# Patient Record
Sex: Male | Born: 1995
Health system: Southern US, Community
[De-identification: ages and names within clinical notes are randomized; demographics above are authoritative.]

## PROBLEM LIST (undated history)

## (undated) DIAGNOSIS — J45909 Unspecified asthma, uncomplicated: Secondary | ICD-10-CM

## (undated) HISTORY — PX: WISDOM TOOTH EXTRACTION: SHX21

---
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
## 2016-01-12 DIAGNOSIS — J4541 Moderate persistent asthma with (acute) exacerbation: Secondary | ICD-10-CM | POA: Diagnosis not present

## 2016-01-12 MED FILL — predniSONE 20 MG TABS: 20 | 5 days supply | Qty: 15 | Fill #0

## 2016-02-05 MED FILL — ADVAIR 100/50 DISKUS: 100-50 | 30 days supply | Qty: 60 | Fill #1

## 2016-04-23 DIAGNOSIS — J329 Chronic sinusitis, unspecified: Secondary | ICD-10-CM | POA: Diagnosis not present

## 2016-04-23 DIAGNOSIS — B9689 Other specified bacterial agents as the cause of diseases classified elsewhere: Secondary | ICD-10-CM | POA: Diagnosis not present

## 2016-04-23 MED FILL — CEFDINIR 300 MG CAPSULE: 300 | 10 days supply | Qty: 20 | Fill #0

## 2016-07-09 MED FILL — ADVAIR 100/50 DISKUS: 100-50 | 30 days supply | Qty: 60 | Fill #0

## 2016-10-10 DIAGNOSIS — J019 Acute sinusitis, unspecified: Secondary | ICD-10-CM | POA: Diagnosis not present

## 2016-10-10 DIAGNOSIS — B9689 Other specified bacterial agents as the cause of diseases classified elsewhere: Secondary | ICD-10-CM | POA: Diagnosis not present

## 2016-10-10 DIAGNOSIS — J40 Bronchitis, not specified as acute or chronic: Secondary | ICD-10-CM | POA: Diagnosis not present

## 2016-10-10 DIAGNOSIS — J9801 Acute bronchospasm: Secondary | ICD-10-CM | POA: Diagnosis not present

## 2016-10-10 DIAGNOSIS — J309 Allergic rhinitis, unspecified: Secondary | ICD-10-CM | POA: Diagnosis not present

## 2016-10-10 MED FILL — AZITHROMYCIN 500 MG TABLET: 500 | 3 days supply | Qty: 3 | Fill #0

## 2016-10-10 MED FILL — predniSONE 50 MG TABS: 50 | 5 days supply | Qty: 5 | Fill #0

## 2016-11-18 DIAGNOSIS — Z23 Encounter for immunization: Secondary | ICD-10-CM | POA: Diagnosis not present

## 2016-11-29 MED FILL — MONTELUKAST SOD 10 MG TAB: 10 | 30 days supply | Qty: 30 | Fill #0

## 2016-11-29 MED FILL — ADVAIR 100/50 DISKUS: 100-50 | 30 days supply | Qty: 60 | Fill #0

## 2016-12-09 MED FILL — CONCERTA 36 MG TABLET ER: 36 | 90 days supply | Qty: 90 | Fill #0

## 2017-04-23 DIAGNOSIS — Z0100 Encounter for examination of eyes and vision without abnormal findings: Secondary | ICD-10-CM | POA: Diagnosis not present

## 2017-08-07 DIAGNOSIS — F4310 Post-traumatic stress disorder, unspecified: Secondary | ICD-10-CM | POA: Diagnosis not present

## 2017-08-13 DIAGNOSIS — F4310 Post-traumatic stress disorder, unspecified: Secondary | ICD-10-CM | POA: Diagnosis not present

## 2017-08-21 DIAGNOSIS — F4310 Post-traumatic stress disorder, unspecified: Secondary | ICD-10-CM | POA: Diagnosis not present

## 2017-09-10 DIAGNOSIS — J4540 Moderate persistent asthma, uncomplicated: Secondary | ICD-10-CM | POA: Diagnosis not present

## 2017-09-10 DIAGNOSIS — J019 Acute sinusitis, unspecified: Secondary | ICD-10-CM | POA: Diagnosis not present

## 2017-09-10 DIAGNOSIS — B9689 Other specified bacterial agents as the cause of diseases classified elsewhere: Secondary | ICD-10-CM | POA: Diagnosis not present

## 2017-09-10 MED FILL — MONTELUKAST SOD 10 MG TAB: 10 | 30 days supply | Qty: 30 | Fill #0

## 2017-09-10 MED FILL — ADVAIR 100/50 DISKUS: 100-50 | 30 days supply | Qty: 60 | Fill #0

## 2017-09-10 MED FILL — CEFDINIR 300 MG CAPSULE: 300 | 10 days supply | Qty: 20 | Fill #0

## 2017-09-10 MED FILL — VENTOLIN HFA 90 MCG INHALER: 108 (90 BAS | 17 days supply | Qty: 18 | Fill #0

## 2017-10-20 DIAGNOSIS — F4310 Post-traumatic stress disorder, unspecified: Secondary | ICD-10-CM | POA: Diagnosis not present

## 2017-10-29 DIAGNOSIS — F418 Other specified anxiety disorders: Secondary | ICD-10-CM | POA: Diagnosis not present

## 2017-10-29 DIAGNOSIS — F4310 Post-traumatic stress disorder, unspecified: Secondary | ICD-10-CM | POA: Diagnosis not present

## 2017-10-29 DIAGNOSIS — F909 Attention-deficit hyperactivity disorder, unspecified type: Secondary | ICD-10-CM | POA: Diagnosis not present

## 2017-10-29 DIAGNOSIS — J4530 Mild persistent asthma, uncomplicated: Secondary | ICD-10-CM | POA: Diagnosis not present

## 2017-10-29 DIAGNOSIS — Z23 Encounter for immunization: Secondary | ICD-10-CM | POA: Diagnosis not present

## 2017-10-29 MED FILL — ADVAIR 100/50 DISKUS: 100-50 | 30 days supply | Qty: 60 | Fill #0

## 2017-10-29 MED FILL — MONTELUKAST SOD 10 MG TAB: 10 | 30 days supply | Qty: 30 | Fill #0

## 2017-10-29 MED FILL — SERTRALINE HCL 50 MG TABLET: 50 | 30 days supply | Qty: 30 | Fill #0

## 2017-11-12 DIAGNOSIS — F411 Generalized anxiety disorder: Secondary | ICD-10-CM | POA: Diagnosis not present

## 2017-11-19 DIAGNOSIS — F4310 Post-traumatic stress disorder, unspecified: Secondary | ICD-10-CM | POA: Diagnosis not present

## 2017-12-01 MED FILL — OSELTAMIVIR PHOSPHATE 75 MG: 75 | 10 days supply | Qty: 10 | Fill #0

## 2017-12-02 DIAGNOSIS — F411 Generalized anxiety disorder: Secondary | ICD-10-CM | POA: Diagnosis not present

## 2017-12-03 MED FILL — SERTRALINE HCL 50 MG TABLET: 50 | 30 days supply | Qty: 30 | Fill #1

## 2017-12-10 DIAGNOSIS — F418 Other specified anxiety disorders: Secondary | ICD-10-CM | POA: Diagnosis not present

## 2017-12-16 DIAGNOSIS — F411 Generalized anxiety disorder: Secondary | ICD-10-CM | POA: Diagnosis not present

## 2017-12-29 MED FILL — SERTRALINE HCL 50 MG TABLET: 50 | 90 days supply | Qty: 90 | Fill #0

## 2018-01-14 DIAGNOSIS — F902 Attention-deficit hyperactivity disorder, combined type: Secondary | ICD-10-CM | POA: Diagnosis not present

## 2018-01-22 DIAGNOSIS — J4541 Moderate persistent asthma with (acute) exacerbation: Secondary | ICD-10-CM | POA: Diagnosis not present

## 2018-01-22 DIAGNOSIS — J0110 Acute frontal sinusitis, unspecified: Secondary | ICD-10-CM | POA: Diagnosis not present

## 2018-01-22 MED FILL — AMOXICILLIN 500 MG CAPSULE: 500 | 10 days supply | Qty: 40 | Fill #0

## 2018-02-18 DIAGNOSIS — F4310 Post-traumatic stress disorder, unspecified: Secondary | ICD-10-CM | POA: Diagnosis not present

## 2018-04-03 MED FILL — SERTRALINE HCL 50 MG TABLET: 50 | 90 days supply | Qty: 90 | Fill #1

## 2018-05-07 DIAGNOSIS — Z0100 Encounter for examination of eyes and vision without abnormal findings: Secondary | ICD-10-CM | POA: Diagnosis not present

## 2018-06-09 DIAGNOSIS — Z23 Encounter for immunization: Secondary | ICD-10-CM | POA: Diagnosis not present

## 2018-06-09 DIAGNOSIS — J4530 Mild persistent asthma, uncomplicated: Secondary | ICD-10-CM | POA: Diagnosis not present

## 2018-06-09 DIAGNOSIS — F418 Other specified anxiety disorders: Secondary | ICD-10-CM | POA: Diagnosis not present

## 2018-07-10 MED FILL — SERTRALINE HCL 50 MG TABLET: 50 | 90 days supply | Qty: 90 | Fill #0

## 2018-08-20 ENCOUNTER — Ambulatory Visit (INDEPENDENT_AMBULATORY_CARE_PROVIDER_SITE_OTHER): Payer: Self-pay | Admitting: Nurse Practitioner

## 2018-08-20 VITALS — BP 100/80 | HR 144 | Temp 101.9°F | Wt 201.8 lb

## 2018-08-20 DIAGNOSIS — J101 Influenza due to other identified influenza virus with other respiratory manifestations: Secondary | ICD-10-CM

## 2018-08-20 MED ORDER — ONDANSETRON 4 MG PO TBDP: 4.0000 mg | ORAL_TABLET | Freq: Three times a day (TID) | ORAL | 0 refills | Status: AC | PRN

## 2018-08-20 MED ORDER — OSELTAMIVIR PHOSPHATE 75 MG PO CAPS: 75.0000 mg | ORAL_CAPSULE | Freq: Two times a day (BID) | ORAL | 0 refills | Status: AC

## 2018-08-20 NOTE — Progress Notes (Signed)
Subjective:     Larry Espinoza is a 22 y.o. male who presents for evaluation of influenza like symptoms. Symptoms include fevers up to 102.4 degrees, chills, headache, myalgias, productive cough and sore throat and have been present for today  He has tried to alleviate the symptoms with rest with no relief. High risk factors for influenza complications: co-morbid illness.  The patient does have an underlying history of asthma.  Patient states he did have to use his inhaler today for wheezing.  Patient states his asthma is otherwise well controlled.  No recent asthma exacerbations.  Patient also states he uses his inhaler very seldomly.  Patient has had an influenza vaccine this season.  The following portions of the patient's history were reviewed and updated as appropriate: allergies, current medications and past medical history.  Review of Systems Constitutional: positive for anorexia, chills, fatigue, fevers, malaise and sweats, negative for night sweats and weight loss Eyes: negative Ears, nose, mouth, throat, and face: positive for nasal congestion and sore throat, negative for ear drainage, earaches and hoarseness Respiratory: positive for asthma, cough, sputum and wheezing, negative for dyspnea on exertion, pneumonia and stridor Cardiovascular: negative Gastrointestinal: positive for nausea and decreased appetite, negative for diarrhea, nausea and vomiting Neurological: positive for headaches, negative for coordination problems, dizziness, paresthesia, tremors, vertigo and weakness     Objective:    BP 100/80 (BP Location: Right Arm, Patient Position: Sitting)   Pulse (!) 144   Temp (!) 101.9 F (38.8 C) (Oral)   Wt 201 lb 12.8 oz (91.5 kg)   SpO2 98%   BMI 28.15 kg/m  General appearance: alert, cooperative, fatigued and ill-appearing Head: Normocephalic, without obvious abnormality, atraumatic Eyes: conjunctivae/corneas clear. PERRL, EOM's intact. Fundi benign. Ears: normal TM's  and external ear canals both ears Nose: no discharge, moderate congestion, sinus tenderness bilateral Throat: abnormal findings: moderate oropharyngeal erythema and no edema, tonsils 0 bilaterally Lungs: clear to auscultation bilaterally Heart: regular rate and rhythm, S1, S2 normal, no murmur, click, rub or gallop Abdomen: soft, non-tender; bowel sounds normal; no masses,  no organomegaly Pulses: 2+ and symmetric Skin: Skin color, texture, turgor normal. No rashes or lesions Lymph nodes: mild bilateral cervical adenopathy Neurologic: Grossly normal    Patient vomited a large amount of emesis during this visit.  Contained normal food content.  Assessment:   Influenza A   Plan:   Exam findings, diagnosis etiology and medication use and indications reviewed with patient. Follow- Up and discharge instructions provided. No emergent/urgent issues found on exam. Patient education was provided. Patient verbalized understanding of information provided and agrees with plan of care (POC), all questions answered. The patient is advised to call or return to clinic if condition does not see an improvement in symptoms, or to seek the care of the closest emergency department if condition worsens with the above plan.   1. Influenza A  - oseltamivir (TAMIFLU) 75 MG capsule; Take 1 capsule (75 mg total) by mouth 2 (two) times daily for 5 days.  Dispense: 10 capsule; Refill: 0 -Take medication as prescribed. -Ibuprofen or Tylenol for pain, fever, or general discomfort. -Increase fluids to prevent dehydration. -Use a humidifier or vaporizer as needed. -Warm salt water gargles for throat pain or discomfort. -Good hand washing while symptoms persist. -Make sure to cover your mouth with coughing or sneezing. -Follow-up in the emergency department if you have worsening fever, abdominal pains, difficulty breathing, or other concerns. -Note was provided to return to work and school  on Monday, August 24, 2018.

## 2018-08-20 NOTE — Patient Instructions (Addendum)
Influenza, Adult  -Take medication as prescribed. -Ibuprofen or Tylenol for pain, fever, or general discomfort. -Increase fluids to prevent dehydration. -Use a humidifier or vaporizer as needed. -Warm salt water gargles for throat pain or discomfort. -Good hand washing while symptoms persist. -Make sure to cover your mouth with coughing or sneezing. -Follow-up in the emergency department if you have worsening fever, abdominal pains, difficulty breathing, or other concerns. -Note was provided to return to work and school on Monday, August 24, 2018.   Influenza, more commonly known as "the flu," is a viral infection that primarily affects the respiratory tract. The respiratory tract includes organs that help you breathe, such as the lungs, nose, and throat. The flu causes many common cold symptoms, as well as a high fever and body aches. The flu spreads easily from person to person (is contagious). Getting a flu shot (influenza vaccination) every year is the best way to prevent influenza. What are the causes? Influenza is caused by a virus. You can catch the virus by:  Breathing in droplets from an infected person's cough or sneeze.  Touching something that was recently contaminated with the virus and then touching your mouth, nose, or eyes.  What increases the risk? The following factors may make you more likely to get the flu:  Not cleaning your hands frequently with soap and water or alcohol-based hand sanitizer.  Having close contact with many people during cold and flu season.  Touching your mouth, eyes, or nose without washing or sanitizing your hands first.  Not drinking enough fluids or not eating a healthy diet.  Not getting enough sleep or exercise.  Being under a high amount of stress.  Not getting a yearly (annual) flu shot.  You may be at a higher risk of complications from the flu, such as a severe lung infection (pneumonia), if you:  Are over the age of  51.  Are pregnant.  Have a weakened disease-fighting system (immune system). You may have a weakened immune system if you: ? Have HIV or AIDS. ? Are undergoing chemotherapy. ? Aretaking medicines that reduce the activity of (suppress) the immune system.  Have a long-term (chronic) illness, such as heart disease, kidney disease, diabetes, or lung disease.  Have a liver disorder.  Are obese.  Have anemia.  What are the signs or symptoms? Symptoms of this condition typically last 4-10 days and may include:  Fever.  Chills.  Headache, body aches, or muscle aches.  Sore throat.  Cough.  Runny or congested nose.  Chest discomfort and cough.  Poor appetite.  Weakness or tiredness (fatigue).  Dizziness.  Nausea or vomiting.  How is this diagnosed? This condition may be diagnosed based on your medical history and a physical exam. Your health care provider may do a nose or throat swab test to confirm the diagnosis. How is this treated? If influenza is detected early, you can be treated with antiviral medicine that can reduce the length of your illness and the severity of your symptoms. This medicine may be given by mouth (orally) or through an IV tube that is inserted in one of your veins. The goal of treatment is to relieve symptoms by taking care of yourself at home. This may include taking over-the-counter medicines, drinking plenty of fluids, and adding humidity to the air in your home. In some cases, influenza goes away on its own. Severe influenza or complications from influenza may be treated in a hospital. Follow these instructions at home:  Take  over-the-counter and prescription medicines only as told by your health care provider.  Use a cool mist humidifier to add humidity to the air in your home. This can make breathing easier.  Rest as needed.  Drink enough fluid to keep your urine clear or pale yellow.  Cover your mouth and nose when you cough or  sneeze.  Wash your hands with soap and water often, especially after you cough or sneeze. If soap and water are not available, use hand sanitizer.  Stay home from work or school as told by your health care provider. Unless you are visiting your health care provider, try to avoid leaving home until your fever has been gone for 24 hours without the use of medicine.  Keep all follow-up visits as told by your health care provider. This is important. How is this prevented?  Getting an annual flu shot is the best way to avoid getting the flu. You may get the flu shot in late summer, fall, or winter. Ask your health care provider when you should get your flu shot.  Wash your hands often or use hand sanitizer often.  Avoid contact with people who are sick during cold and flu season.  Eat a healthy diet, drink plenty of fluids, get enough sleep, and exercise regularly. Contact a health care provider if:  You develop new symptoms.  You have: ? Chest pain. ? Diarrhea. ? A fever.  Your cough gets worse.  You produce more mucus.  You feel nauseous or you vomit. Get help right away if:  You develop shortness of breath or difficulty breathing.  Your skin or nails turn a bluish color.  You have severe pain or stiffness in your neck.  You develop a sudden headache or sudden pain in your face or ear.  You cannot stop vomiting. This information is not intended to replace advice given to you by your health care provider. Make sure you discuss any questions you have with your health care provider. Document Released: 09/27/2000 Document Revised: 03/07/2016 Document Reviewed: 07/25/2015 Elsevier Interactive Patient Education  2017 [employer].

## 2018-08-24 DIAGNOSIS — J019 Acute sinusitis, unspecified: Secondary | ICD-10-CM | POA: Diagnosis not present

## 2018-08-24 LAB — POCT INFLUENZA A/B
Influenza A, POC: POSITIVE — AB
Influenza B, POC: NEGATIVE

## 2018-08-24 MED FILL — AMOX-CLAV 875-125 MG TABLET: 875-125 | 10 days supply | Qty: 20 | Fill #0

## 2018-08-24 NOTE — Addendum Note (Signed)
Addended by: Arnette Felts on: 08/24/2018 08:12 AM   Modules accepted: Orders

## 2018-09-15 DIAGNOSIS — J4530 Mild persistent asthma, uncomplicated: Secondary | ICD-10-CM | POA: Diagnosis not present

## 2018-09-15 DIAGNOSIS — J0100 Acute maxillary sinusitis, unspecified: Secondary | ICD-10-CM | POA: Diagnosis not present

## 2018-09-15 MED FILL — AMOX-CLAV 875-125 MG TABLET: 875-125 | 10 days supply | Qty: 20 | Fill #0

## 2018-11-18 DIAGNOSIS — Z23 Encounter for immunization: Secondary | ICD-10-CM | POA: Diagnosis not present

## 2018-11-18 DIAGNOSIS — Z0000 Encounter for general adult medical examination without abnormal findings: Secondary | ICD-10-CM | POA: Diagnosis not present

## 2018-11-18 DIAGNOSIS — J4530 Mild persistent asthma, uncomplicated: Secondary | ICD-10-CM | POA: Diagnosis not present

## 2018-11-18 DIAGNOSIS — F418 Other specified anxiety disorders: Secondary | ICD-10-CM | POA: Diagnosis not present

## 2018-11-18 DIAGNOSIS — Z13220 Encounter for screening for lipoid disorders: Secondary | ICD-10-CM | POA: Diagnosis not present

## 2018-11-18 DIAGNOSIS — J309 Allergic rhinitis, unspecified: Secondary | ICD-10-CM | POA: Diagnosis not present

## 2019-05-24 DIAGNOSIS — M25561 Pain in right knee: Secondary | ICD-10-CM | POA: Diagnosis not present

## 2019-05-24 DIAGNOSIS — M25562 Pain in left knee: Secondary | ICD-10-CM | POA: Diagnosis not present

## 2019-06-19 ENCOUNTER — Telehealth: Payer: 59 | Admitting: Family

## 2019-06-19 DIAGNOSIS — Z20822 Contact with and (suspected) exposure to covid-19: Secondary | ICD-10-CM

## 2019-06-19 NOTE — Progress Notes (Signed)
E-Visit for Corona Virus Screening   Your current symptoms could be consistent with the coronavirus.  Many health care providers can now test patients at their office but not all are.  [facility] has multiple testing sites. For information on our COVID testing locations and hours go to [email or web address]     Testing is available Monday through Friday 8am-330pm at [address] You do not require emergency testing. Please self-quarantine for now and report for testing Monday.   Please quarantine yourself while awaiting your test results.  We are enrolling you in our [facility] . Daily you will receive a questionnaire within the [facility] website. Our COVID 19 response team willl be monitoriing your responses daily.    COVID-19 is a respiratory illness with symptoms that are similar to the flu. Symptoms are typically mild to moderate, but there have been cases of severe illness and death due to the virus. The following symptoms may appear 2-14 days after exposure: . Fever . Cough . Shortness of breath or difficulty breathing . Chills . Repeated shaking with chills . Muscle pain . Headache . Sore throat . New loss of taste or smell . Fatigue . Congestion or runny nose . Nausea or vomiting . Diarrhea  It is vitally important that if you feel that you have an infection such as this virus or any other virus that you stay home and away from places where you may spread it to others.  You should self-quarantine for 14 days if you have symptoms that could potentially be coronavirus or have been in close contact a with a person diagnosed with COVID-19 within the last 2 weeks. You should avoid contact with people age 3 and older.   You should wear a mask or cloth face covering over your nose and mouth if you must be around other people or animals, including pets (even at home). Try to stay at least 6 feet away from other people. This  will protect the people around you.   You may also take acetaminophen (Tylenol) as needed for fever.   Reduce your risk of any infection by using the same precautions used for avoiding the common cold or flu:  Marland Kitchen Wash your hands often with soap and warm water for at least 20 seconds.  If soap and water are not readily available, use an alcohol-based hand sanitizer with at least 60% alcohol.  . If coughing or sneezing, cover your mouth and nose by coughing or sneezing into the elbow areas of your shirt or coat, into a tissue or into your sleeve (not your hands). . Avoid shaking hands with others and consider head nods or verbal greetings only. . Avoid touching your eyes, nose, or mouth with unwashed hands.  . Avoid close contact with people who are sick. . Avoid places or events with large numbers of people in one location, like concerts or sporting events. . Carefully consider travel plans you have or are making. . If you are planning any travel outside or inside the [place], visit the CDC's Travelers' Health webpage for the latest health notices. . If you have some symptoms but not all symptoms, continue to monitor at home and seek medical attention if your symptoms worsen. . If you are having a medical emergency, call 911.  HOME CARE . Only take medications as instructed by your medical team. . Drink plenty of fluids and get plenty of rest. . A steam or ultrasonic humidifier can help  if you have congestion.   GET HELP RIGHT AWAY IF YOU HAVE EMERGENCY WARNING SIGNS** FOR COVID-19. If you or someone is showing any of these signs seek emergency medical care immediately. Call 911 or proceed to your closest emergency facility if: . You develop worsening high fever. . Trouble breathing . Bluish lips or face . Persistent pain or pressure in the chest . New confusion . Inability to wake or stay awake . You cough up blood. . Your symptoms become more severe  **This list is not all possible  symptoms. Contact your medical provider for any symptoms that are sever or concerning to you.   MAKE SURE YOU   Understand these instructions.  Will watch your condition.  Will get help right away if you are not doing well or get worse.  Your e-visit answers were reviewed by a board certified advanced clinical practitioner to complete your personal care plan.  Depending on the condition, your plan could have included both over the counter or prescription medications.  If there is a problem please reply once you have received a response from your provider.  Your safety is important to us.  If you have drug allergies check your prescription carefully.    You can use MyChart to ask questions about today's visit, request a non-urgent call back, or ask for a work or school excuse for 24 hours related to this e-Visit. If it has been greater than 24 hours you will need to follow up with your provider, or enter a new e-Visit to address those concerns. You will get an e-mail in the next two days asking about your experience.  I hope that your e-visit has been valuable and will speed your recovery. Thank you for using e-visits.   Greater than 5 minutes, yet less than 10 minutes of time have been spent researching, coordinating, and implementing care for this patient today.  Thank you for the details you included in the comment boxes. Those details are very helpful in determining the best course of treatment for you and help us to provide the best care.

## 2019-06-22 ENCOUNTER — Other Ambulatory Visit: Payer: Self-pay

## 2019-06-22 DIAGNOSIS — Z20822 Contact with and (suspected) exposure to covid-19: Secondary | ICD-10-CM

## 2019-06-24 LAB — NOVEL CORONAVIRUS, NAA: SARS-CoV-2, NAA: NOT DETECTED

## 2019-11-23 DIAGNOSIS — Z7689 Persons encountering health services in other specified circumstances: Secondary | ICD-10-CM | POA: Diagnosis not present

## 2019-11-23 DIAGNOSIS — Z0000 Encounter for general adult medical examination without abnormal findings: Secondary | ICD-10-CM | POA: Diagnosis not present

## 2019-11-23 DIAGNOSIS — Z13220 Encounter for screening for lipoid disorders: Secondary | ICD-10-CM | POA: Diagnosis not present

## 2019-11-25 DIAGNOSIS — Z0000 Encounter for general adult medical examination without abnormal findings: Secondary | ICD-10-CM | POA: Diagnosis not present

## 2019-11-25 DIAGNOSIS — Z13220 Encounter for screening for lipoid disorders: Secondary | ICD-10-CM | POA: Diagnosis not present

## 2019-11-25 DIAGNOSIS — J4530 Mild persistent asthma, uncomplicated: Secondary | ICD-10-CM | POA: Diagnosis not present

## 2019-11-25 DIAGNOSIS — J309 Allergic rhinitis, unspecified: Secondary | ICD-10-CM | POA: Diagnosis not present

## 2019-11-25 DIAGNOSIS — Z23 Encounter for immunization: Secondary | ICD-10-CM | POA: Diagnosis not present

## 2019-11-25 MED FILL — MONTELUKAST SOD 10 MG TAB: 10 | 90 days supply | Qty: 90 | Fill #0

## 2019-11-25 MED FILL — ADVAIR 100/50 DISKUS: 100-50 | 30 days supply | Qty: 60 | Fill #0

## 2019-11-25 MED FILL — ALBUTEROL SULFATE HFA 108 (: 108 (90 BAS | 25 days supply | Qty: 18 | Fill #0

## 2019-12-24 DIAGNOSIS — J0110 Acute frontal sinusitis, unspecified: Secondary | ICD-10-CM | POA: Diagnosis not present

## 2019-12-24 MED FILL — AMOX-CLAV 875-125 MG TABLET: 875-125 | 5 days supply | Qty: 10 | Fill #0

## 2020-04-25 MED FILL — ALBUTEROL SULFATE HFA 108 (: 108 (90 BAS | 25 days supply | Qty: 18 | Fill #1

## 2020-06-13 DIAGNOSIS — Z0100 Encounter for examination of eyes and vision without abnormal findings: Secondary | ICD-10-CM | POA: Diagnosis not present

## 2020-06-14 DIAGNOSIS — U071 COVID-19: Secondary | ICD-10-CM

## 2020-06-14 HISTORY — DX: COVID-19: U07.1

## 2020-07-03 DIAGNOSIS — J069 Acute upper respiratory infection, unspecified: Secondary | ICD-10-CM | POA: Diagnosis not present

## 2020-07-03 DIAGNOSIS — Z20822 Contact with and (suspected) exposure to covid-19: Secondary | ICD-10-CM | POA: Diagnosis not present

## 2020-07-03 MED FILL — BENZONATATE 100 MG CAPS: 100 | 10 days supply | Qty: 30 | Fill #0

## 2020-07-05 ENCOUNTER — Other Ambulatory Visit: Payer: Self-pay | Admitting: Internal Medicine

## 2020-07-05 DIAGNOSIS — J4521 Mild intermittent asthma with (acute) exacerbation: Secondary | ICD-10-CM | POA: Diagnosis not present

## 2020-07-05 DIAGNOSIS — J45909 Unspecified asthma, uncomplicated: Secondary | ICD-10-CM

## 2020-07-05 DIAGNOSIS — U071 COVID-19: Secondary | ICD-10-CM | POA: Diagnosis not present

## 2020-07-05 MED FILL — predniSONE 10 MG TABS: 10 | 12 days supply | Qty: 30 | Fill #0

## 2020-07-05 MED FILL — AZITHROMYCIN 250 MG TABS: 250 | 5 days supply | Qty: 6 | Fill #0

## 2020-07-05 NOTE — Progress Notes (Signed)
I connected by phone with Larry Espinoza on 07/05/2020 at 7:21 PM to discuss the potential use of a new treatment for mild to moderate COVID-19 viral infection in non-hospitalized patients.  This patient is a 24 y.o. male that meets the FDA criteria for Emergency Use Authorization of COVID monoclonal antibody casirivimab/imdevimab.  Has a (+) direct SARS-CoV-2 viral test result  Has mild or moderate COVID-19   Is NOT hospitalized due to COVID-19  Is within 10 days of symptom onset  Has at least one of the high risk factor(s) for progression to severe COVID-19 and/or hospitalization as defined in EUA.  Specific high risk criteria : Chronic Lung Disease   I have spoken and communicated the following to the patient or parent/caregiver regarding COVID monoclonal antibody treatment:  1. FDA has authorized the emergency use for the treatment of mild to moderate COVID-19 in adults and pediatric patients with positive results of direct SARS-CoV-2 viral testing who are 28 years of age and older weighing at least 40 kg, and who are at high risk for progressing to severe COVID-19 and/or hospitalization.  2. The significant known and potential risks and benefits of COVID monoclonal antibody, and the extent to which such potential risks and benefits are unknown.  3. Information on available alternative treatments and the risks and benefits of those alternatives, including clinical trials.  4. Patients treated with COVID monoclonal antibody should continue to self-isolate and use infection control measures (e.g., wear mask, isolate, social distance, avoid sharing personal items, clean and disinfect "high touch" surfaces, and frequent handwashing) according to CDC guidelines.   5. The patient or parent/caregiver has the option to accept or refuse COVID monoclonal antibody treatment.  After reviewing this information with the patient, The patient agreed to proceed with receiving casirivimab\imdevimab  infusion and will be provided a copy of the Fact sheet prior to receiving the infusion.  Cyndee Brightly, NP [facility]

## 2020-07-06 ENCOUNTER — Ambulatory Visit (HOSPITAL_COMMUNITY)
Admission: RE | Admit: 2020-07-06 | Discharge: 2020-07-06 | Disposition: A | Discharge status: Home / Self Care | Payer: 59 | Source: Ambulatory Visit | Attending: Pulmonary Disease | Admitting: Pulmonary Disease

## 2020-07-06 DIAGNOSIS — J45909 Unspecified asthma, uncomplicated: Secondary | ICD-10-CM | POA: Diagnosis not present

## 2020-07-06 DIAGNOSIS — U071 COVID-19: Secondary | ICD-10-CM | POA: Insufficient documentation

## 2020-07-06 MED ORDER — ONDANSETRON HCL 4 MG/2ML IJ SOLN
INTRAMUSCULAR | Status: AC
  Administered 2020-07-06: 4 mg
  Filled 2020-07-06: qty 2

## 2020-07-06 MED ORDER — SODIUM CHLORIDE 0.9 % IV SOLN: Freq: Once | INTRAVENOUS | Status: AC

## 2020-07-06 MED ORDER — ONDANSETRON HCL 4 MG/2ML IJ SOLN: 4.0000 mg | Freq: Once | INTRAMUSCULAR | Status: DC

## 2020-07-06 MED ORDER — SODIUM CHLORIDE 0.9 % IV SOLN: INTRAVENOUS | Status: DC | PRN

## 2020-07-06 MED ORDER — DIPHENHYDRAMINE HCL 50 MG/ML IJ SOLN: 50.0000 mg | Freq: Once | INTRAMUSCULAR | Status: DC | PRN

## 2020-07-06 MED ORDER — EPINEPHRINE 0.3 MG/0.3ML IJ SOAJ: 0.3000 mg | Freq: Once | INTRAMUSCULAR | Status: DC | PRN

## 2020-07-06 MED ORDER — SODIUM CHLORIDE 0.9 % IV SOLN
1200.0000 mg | Freq: Once | INTRAVENOUS | Status: AC
  Administered 2020-07-06: 1200 mg via INTRAVENOUS

## 2020-07-06 MED ORDER — METHYLPREDNISOLONE SODIUM SUCC 125 MG IJ SOLR: 125.0000 mg | Freq: Once | INTRAMUSCULAR | Status: DC | PRN

## 2020-07-06 MED ORDER — FAMOTIDINE IN NACL 20-0.9 MG/50ML-% IV SOLN: 20.0000 mg | Freq: Once | INTRAVENOUS | Status: DC | PRN

## 2020-07-06 MED ORDER — ALBUTEROL SULFATE HFA 108 (90 BASE) MCG/ACT IN AERS: 2.0000 | INHALATION_SPRAY | Freq: Once | RESPIRATORY_TRACT | Status: DC | PRN

## 2020-07-06 NOTE — Progress Notes (Signed)
  Diagnosis: COVID-19  Physician: Dr. Wright  Procedure: [facility] Med: casirivimab\imdevimab infusion - Provided patient with casirivimab\imdevimab fact sheet for patients, parents and caregivers prior to infusion.  Complications: No immediate complications noted.  Discharge: Discharged home   Larry Espinoza R Johndavid Geralds 07/06/2020   

## 2020-07-06 NOTE — Progress Notes (Signed)
Nausea relieved after zofran in given

## 2020-07-06 NOTE — Discharge Instructions (Signed)

## 2020-10-23 DIAGNOSIS — B079 Viral wart, unspecified: Secondary | ICD-10-CM | POA: Diagnosis not present

## 2020-10-23 DIAGNOSIS — B070 Plantar wart: Secondary | ICD-10-CM | POA: Diagnosis not present

## 2020-11-24 ENCOUNTER — Other Ambulatory Visit (HOSPITAL_COMMUNITY): Payer: Self-pay | Admitting: Physician Assistant

## 2020-11-24 ENCOUNTER — Ambulatory Visit
Admission: RE | Admit: 2020-11-24 | Discharge: 2020-11-24 | Disposition: A | Discharge status: Home / Self Care | Payer: 59 | Source: Ambulatory Visit | Attending: Physician Assistant | Admitting: Physician Assistant

## 2020-11-24 ENCOUNTER — Other Ambulatory Visit: Payer: Self-pay | Admitting: Physician Assistant

## 2020-11-24 DIAGNOSIS — R109 Unspecified abdominal pain: Secondary | ICD-10-CM

## 2020-11-24 DIAGNOSIS — R110 Nausea: Secondary | ICD-10-CM | POA: Diagnosis not present

## 2020-11-24 DIAGNOSIS — R3989 Other symptoms and signs involving the genitourinary system: Secondary | ICD-10-CM | POA: Diagnosis not present

## 2020-11-24 DIAGNOSIS — Z7689 Persons encountering health services in other specified circumstances: Secondary | ICD-10-CM | POA: Diagnosis not present

## 2020-11-24 MED FILL — traMADol HCL 50 MG TABS: 50 | 10 days supply | Qty: 10 | Fill #0

## 2020-11-24 MED FILL — ONDANSETRON ODT 4 MG TABLET: 4 | 10 days supply | Qty: 10 | Fill #0

## 2020-11-28 ENCOUNTER — Other Ambulatory Visit: Payer: Self-pay

## 2020-11-28 ENCOUNTER — Other Ambulatory Visit: Payer: Self-pay | Admitting: Physician Assistant

## 2020-11-28 ENCOUNTER — Ambulatory Visit
Admission: RE | Admit: 2020-11-28 | Discharge: 2020-11-28 | Disposition: A | Discharge status: Home / Self Care | Payer: 59 | Source: Ambulatory Visit | Attending: Physician Assistant | Admitting: Physician Assistant

## 2020-11-28 DIAGNOSIS — J4530 Mild persistent asthma, uncomplicated: Secondary | ICD-10-CM | POA: Diagnosis not present

## 2020-11-28 DIAGNOSIS — Z13220 Encounter for screening for lipoid disorders: Secondary | ICD-10-CM | POA: Diagnosis not present

## 2020-11-28 DIAGNOSIS — R748 Abnormal levels of other serum enzymes: Secondary | ICD-10-CM | POA: Diagnosis not present

## 2020-11-28 DIAGNOSIS — R109 Unspecified abdominal pain: Secondary | ICD-10-CM

## 2020-11-28 DIAGNOSIS — Z0000 Encounter for general adult medical examination without abnormal findings: Secondary | ICD-10-CM | POA: Diagnosis not present

## 2020-11-28 DIAGNOSIS — B079 Viral wart, unspecified: Secondary | ICD-10-CM | POA: Diagnosis not present

## 2020-11-28 DIAGNOSIS — B070 Plantar wart: Secondary | ICD-10-CM | POA: Diagnosis not present

## 2020-11-28 DIAGNOSIS — R10A0 Flank pain, unspecified side: Secondary | ICD-10-CM

## 2020-11-28 DIAGNOSIS — J309 Allergic rhinitis, unspecified: Secondary | ICD-10-CM | POA: Diagnosis not present

## 2020-11-29 ENCOUNTER — Encounter: Payer: Self-pay | Admitting: Internal Medicine

## 2020-11-29 ENCOUNTER — Other Ambulatory Visit: Payer: Self-pay

## 2020-11-29 ENCOUNTER — Emergency Department (HOSPITAL_BASED_OUTPATIENT_CLINIC_OR_DEPARTMENT_OTHER)
Admission: EM | Admit: 2020-11-29 | Discharge: 2020-11-29 | Disposition: A | Discharge status: Home / Self Care | Payer: 59 | Attending: Emergency Medicine | Admitting: Emergency Medicine

## 2020-11-29 ENCOUNTER — Emergency Department (HOSPITAL_BASED_OUTPATIENT_CLINIC_OR_DEPARTMENT_OTHER): Payer: 59

## 2020-11-29 ENCOUNTER — Encounter (HOSPITAL_BASED_OUTPATIENT_CLINIC_OR_DEPARTMENT_OTHER): Payer: Self-pay | Admitting: Emergency Medicine

## 2020-11-29 ENCOUNTER — Other Ambulatory Visit (HOSPITAL_BASED_OUTPATIENT_CLINIC_OR_DEPARTMENT_OTHER): Payer: Self-pay | Admitting: Emergency Medicine

## 2020-11-29 DIAGNOSIS — R1011 Right upper quadrant pain: Secondary | ICD-10-CM | POA: Diagnosis not present

## 2020-11-29 DIAGNOSIS — K760 Fatty (change of) liver, not elsewhere classified: Secondary | ICD-10-CM | POA: Diagnosis not present

## 2020-11-29 DIAGNOSIS — J45909 Unspecified asthma, uncomplicated: Secondary | ICD-10-CM | POA: Diagnosis not present

## 2020-11-29 DIAGNOSIS — R112 Nausea with vomiting, unspecified: Secondary | ICD-10-CM | POA: Insufficient documentation

## 2020-11-29 DIAGNOSIS — Z7951 Long term (current) use of inhaled steroids: Secondary | ICD-10-CM | POA: Diagnosis not present

## 2020-11-29 DIAGNOSIS — R109 Unspecified abdominal pain: Secondary | ICD-10-CM | POA: Diagnosis not present

## 2020-11-29 LAB — COMPREHENSIVE METABOLIC PANEL
ALT: 71 U/L — ABNORMAL HIGH (ref 0–44)
AST: 31 U/L (ref 15–41)
Albumin: 4.3 g/dL (ref 3.5–5.0)
Alkaline Phosphatase: 77 U/L (ref 38–126)
Anion gap: 8 (ref 5–15)
BUN: 16 mg/dL (ref 6–20)
CO2: 26 mmol/L (ref 22–32)
Calcium: 9.2 mg/dL (ref 8.9–10.3)
Chloride: 104 mmol/L (ref 98–111)
Creatinine, Ser: 0.98 mg/dL (ref 0.61–1.24)
GFR, Estimated: >60 mL/min (ref >60)
Glucose, Bld: 105 mg/dL — ABNORMAL HIGH (ref 70–99)
Potassium: 3.5 mmol/L (ref 3.5–5.1)
Sodium: 138 mmol/L (ref 135–145)
Total Bilirubin: 0.8 mg/dL (ref 0.3–1.2)
Total Protein: 7.7 g/dL (ref 6.5–8.1)

## 2020-11-29 LAB — CBC
HCT: 42 % (ref 39.0–52.0)
Hemoglobin: 13.3 g/dL (ref 13.0–17.0)
MCH: 20 pg — ABNORMAL LOW (ref 26.0–34.0)
MCHC: 31.7 g/dL (ref 30.0–36.0)
MCV: 63.3 fL — ABNORMAL LOW (ref 80.0–100.0)
Platelets: 278 10*3/uL (ref 150–400)
RBC: 6.64 MIL/uL — ABNORMAL HIGH (ref 4.22–5.81)
RDW: 17 % — ABNORMAL HIGH (ref 11.5–15.5)
WBC: 9.2 10*3/uL (ref 4.0–10.5)
nRBC: 0 % (ref 0.0–0.2)

## 2020-11-29 LAB — LIPASE, BLOOD: Lipase: 38 U/L (ref 11–51)

## 2020-11-29 MED ORDER — ONDANSETRON HCL 4 MG/2ML IJ SOLN
4.0000 mg | Freq: Once | INTRAMUSCULAR | Status: AC
  Administered 2020-11-29: 4 mg via INTRAVENOUS
  Filled 2020-11-29: qty 2

## 2020-11-29 MED ORDER — FENTANYL CITRATE (PF) 100 MCG/2ML IJ SOLN
50.0000 ug | Freq: Once | INTRAMUSCULAR | Status: AC
  Administered 2020-11-29: 50 ug via INTRAVENOUS
  Filled 2020-11-29: qty 2

## 2020-11-29 MED ORDER — PANTOPRAZOLE SODIUM 20 MG PO TBEC: 20.0000 mg | DELAYED_RELEASE_TABLET | Freq: Every day | ORAL | 1 refills | Status: DC

## 2020-11-29 MED ORDER — FENTANYL CITRATE (PF) 100 MCG/2ML IJ SOLN
100.0000 ug | Freq: Once | INTRAMUSCULAR | Status: AC
  Administered 2020-11-29: 100 ug via INTRAVENOUS
  Filled 2020-11-29: qty 2

## 2020-11-29 MED FILL — PANTOPRAZOLE SOD DR 20 MG T: 20 | 30 days supply | Qty: 30 | Fill #0

## 2020-11-29 NOTE — ED Notes (Signed)
ED Provider at bedside. 

## 2020-11-29 NOTE — ED Provider Notes (Signed)
[facility] EMERGENCY DEPARTMENT Provider Note   CSN: [id number] Arrival date & time: 11/29/20  0349     History Chief Complaint  Patient presents with  . Abdominal Pain    Larry Espinoza is a 25 y.o. male.  The history is provided by the patient and a parent.  Abdominal Pain Pain location:  RUQ Pain quality: sharp   Pain radiates to:  Back Pain severity:  Moderate Onset quality:  Gradual Timing:  Intermittent Progression:  Worsening Chronicity:  New Relieved by:  Nothing Worsened by:  Movement and palpation Associated symptoms: nausea and vomiting   Associated symptoms: no chest pain, no cough, no dysuria, no fever, no hematemesis, no hematochezia and no melena    Patient reports he had intermittent abdominal pain for the past 4 weeks, but over the past several days has had persistent right upper quadrant abdominal pain. He reports associated nausea and vomiting. He has had outpatient imaging and labs but his symptoms continue    Past Medical History:  Diagnosis Date  . Asthma     There are no problems to display for this patient.   History reviewed. No pertinent surgical history.     Family History  Problem Relation Age of Onset  . Hypertension Other   . Cancer Other     Social History   Tobacco Use  . Smoking status: Never Smoker  . Smokeless tobacco: Never Used  Vaping Use  . Vaping Use: Never used  Substance Use Topics  . Alcohol use: No  . Drug use: No    Home Medications Prior to Admission medications   Medication Sig Start Date End Date Taking? Authorizing Provider  albuterol (PROVENTIL HFA;VENTOLIN HFA) 108 (90 BASE) MCG/ACT inhaler Inhale 1-2 puffs into the lungs every 6 (six) hours as needed for wheezing or shortness of breath.    [provider]  fluticasone (FLONASE) 50 MCG/ACT nasal spray Place 1 spray into both nostrils daily.    [provider]  Fluticasone-Salmeterol (ADVAIR) 100-50 MCG/DOSE AEPB  Inhale 1 puff into the lungs 2 (two) times daily.    [provider]  loratadine (CLARITIN) 10 MG tablet Take 10 mg by mouth daily.    [provider]  methylphenidate 36 MG PO CR tablet Take 36 mg by mouth daily.    [provider]  montelukast (SINGULAIR) 10 MG tablet Take 10 mg by mouth at bedtime.    [provider]  ondansetron (ZOFRAN ODT) 4 MG disintegrating tablet Take 1 tablet (4 mg total) by mouth every 8 (eight) hours as needed for nausea or vomiting. 02/20/15   Piepenbrink, Victorino Dike, PA-C    Allergies    Patient has no known allergies.  Review of Systems   Review of Systems  Constitutional: Negative for fever.  Respiratory: Negative for cough.   Cardiovascular: Negative for chest pain.  Gastrointestinal: Positive for abdominal pain, nausea and vomiting. Negative for hematemesis, hematochezia and melena.  Genitourinary: Negative for dysuria.  All other systems reviewed and are negative.   Physical Exam Updated Vital Signs BP 121/88 (BP Location: Left Arm)   Pulse 72   Temp 98.2 F (36.8 C) (Oral)   Resp 18   Ht 1.803 m (5\' 11" )   Wt 101.2 kg   SpO2 100%   BMI 31.10 kg/m   Physical Exam  CONSTITUTIONAL: Well developed/well nourished HEAD: Normocephalic/atraumatic EYES: EOMI/PERRL, no icterus ENMT: Mucous membranes moist NECK: supple no meningeal signs SPINE/BACK:entire spine nontender CV: S1/S2 noted,  no murmurs/rubs/gallops noted LUNGS: Lungs are clear to auscultation bilaterally, no apparent distress ABDOMEN: soft, moderate RUQ tenderness, no rebound or guarding, bowel sounds noted throughout abdomen GU:no cva tenderness NEURO: Pt is awake/alert/appropriate, moves all extremitiesx4.  No facial droop.   EXTREMITIES: pulses normal/equal, full ROM SKIN: warm, color normal PSYCH: no abnormalities of mood noted, alert and oriented to situation  ED Results / Procedures / Treatments   Labs (all labs ordered are listed, but only  abnormal results are displayed) Labs Reviewed  COMPREHENSIVE METABOLIC PANEL - Abnormal; Notable for the following components:      Result Value   Glucose, Bld 105 (*)    ALT 71 (*)    All other components within normal limits  CBC - Abnormal; Notable for the following components:   RBC 6.64 (*)    MCV 63.3 (*)    MCH 20.0 (*)    RDW 17.0 (*)    All other components within normal limits  LIPASE, BLOOD    EKG None  Radiology CT ABDOMEN PELVIS WO CONTRAST  Result Date: 11/28/2020 CLINICAL DATA:  Flank pain. EXAM: CT ABDOMEN AND PELVIS WITHOUT CONTRAST TECHNIQUE: Multidetector CT imaging of the abdomen and pelvis was performed following the standard protocol without IV contrast. COMPARISON:  None. FINDINGS: Lower chest: Unremarkable. Hepatobiliary: No focal abnormality in the liver on this study without intravenous contrast. Gallbladder nondistended. No intrahepatic or extrahepatic biliary dilation. Pancreas: No focal mass lesion. No dilatation of the main duct. No intraparenchymal cyst. No peripancreatic edema. Spleen: No splenomegaly. No focal mass lesion. Adrenals/Urinary Tract: No adrenal nodule or mass. Unremarkable noncontrast appearance of the kidneys. No renal or ureteral stones. No secondary changes in either kidney or ureter no bladder stones. Stomach/Bowel: Stomach is unremarkable. No gastric wall thickening. No evidence of outlet obstruction. Duodenum is normally positioned as is the ligament of Treitz. No small bowel wall thickening. No small bowel dilatation. The terminal ileum is normal. The appendix is normal. No gross colonic mass. No colonic wall thickening. Vascular/Lymphatic: No abdominal aortic aneurysm. There is no gastrohepatic or hepatoduodenal ligament lymphadenopathy. No retroperitoneal or mesenteric lymphadenopathy. No pelvic sidewall lymphadenopathy. Reproductive: The prostate gland and seminal vesicles are unremarkable. Other: No intraperitoneal free fluid.  Musculoskeletal: No worrisome lytic or sclerotic osseous abnormality. IMPRESSION: No acute findings in the abdomen or pelvis. Specifically, no findings to explain the patient's history of flank pain. No evidence of urinary stone disease. No secondary changes in either kidney or ureter. Electronically Signed   By: Kennith Center M.D.   On: 11/28/2020 14:31    Procedures Procedures   Medications Ordered in ED Medications  fentaNYL (SUBLIMAZE) injection 100 mcg (100 mcg Intravenous Given 11/29/20 0500)  ondansetron (ZOFRAN) injection 4 mg (4 mg Intravenous Given 11/29/20 0456)    ED Course  I have reviewed the triage vital signs and the nursing notes.  Pertinent labs  results that were available during my care of the patient were reviewed by me and considered in my medical decision making (see chart for details).    MDM Rules/Calculators/A&P                          5:23 AM Patient CT abdomen pelvis without contrast performed yesterday did not show any acute findings. However patient has persistent right upper quadrant abdominal pain which could be biliary colic 5:58 AM Patient feeling improved but still uncomfortable.  Plan will be to order ultrasound later in the morning  once ultrasound tech arrives. 6:53 AM Signed out to Dr Rayfield Citizen at shift change with [place] pending Final Clinical Impression(s) / ED Diagnoses Final diagnoses:  None    Rx / DC Orders ED Discharge Orders    None       Zadie Rhine, MD 11/29/20 [phone]

## 2020-11-29 NOTE — ED Triage Notes (Signed)
Pt states he is having abd pain that started last Thursday and has progressively gotten worse  Pt states he has been seeing his dr for same  Pt has had nausea and vomiting tonight   Pt states the pain is in the RUQ and radiates into his back  Pt states pain up in his shoulder blades especially the right one

## 2020-11-29 NOTE — ED Provider Notes (Signed)
Signout from Dr. Bebe Shaggy.  25 year old male here with upper abdominal pain radiating through to the back.  Is been going on intermittently for 4 weeks but more steady over the past several days.  Lab work unremarkable.  Plan is to follow-up on right upper quadrant ultrasound.  If this is negative patient could be discharged to follow-up with his outpatient provider. Physical Exam  BP 121/88 (BP Location: Left Arm)   Pulse 72   Temp 98.2 F (36.8 C) (Oral)   Resp 18   Ht 5\' 11"  (1.803 m)   Wt 101.2 kg   SpO2 100%   BMI 31.10 kg/m   Physical Exam  ED Course/Procedures     Procedures  MDM  Ultrasound showing some hepatic steatosis but no gallstones.  Reviewed with patient and his mother.  Recommended close follow-up with PCP.  Will trial on PPI.  Return instructions discussed       , MD 11/29/20 1931

## 2020-11-29 NOTE — Discharge Instructions (Signed)
You were seen in the emergency department for upper abdominal pain.  You had blood work and an ultrasound of your right upper quadrant that did not show an obvious explanation for your symptoms.  This will need close follow-up with your primary care doctor.  We are starting you on some acid medication.  Return to the emergency department if any worsening or concerning symptoms.  Included below is the report of your ultrasound.  IMPRESSION:  Diffuse increase in liver echogenicity, an appearance indicative of  a degree of hepatic steatosis. No focal liver lesions are  appreciable. Study otherwise unremarkable.

## 2020-11-30 ENCOUNTER — Other Ambulatory Visit (HOSPITAL_COMMUNITY): Payer: Self-pay | Admitting: Physician Assistant

## 2020-11-30 DIAGNOSIS — R1013 Epigastric pain: Secondary | ICD-10-CM | POA: Diagnosis not present

## 2020-11-30 DIAGNOSIS — R748 Abnormal levels of other serum enzymes: Secondary | ICD-10-CM | POA: Diagnosis not present

## 2020-11-30 DIAGNOSIS — Z8349 Family history of other endocrine, nutritional and metabolic diseases: Secondary | ICD-10-CM | POA: Diagnosis not present

## 2020-11-30 DIAGNOSIS — R110 Nausea: Secondary | ICD-10-CM | POA: Diagnosis not present

## 2020-11-30 DIAGNOSIS — K219 Gastro-esophageal reflux disease without esophagitis: Secondary | ICD-10-CM | POA: Diagnosis not present

## 2020-12-02 MED FILL — ONDANSETRON ODT 4 MG TABLET: 4 | 10 days supply | Qty: 10 | Fill #0

## 2021-01-08 DIAGNOSIS — B070 Plantar wart: Secondary | ICD-10-CM | POA: Diagnosis not present

## 2021-01-10 ENCOUNTER — Encounter: Payer: Self-pay | Admitting: Internal Medicine

## 2021-01-10 ENCOUNTER — Ambulatory Visit: Payer: 59 | Admitting: Internal Medicine

## 2021-01-10 VITALS — BP 126/80 | HR 80 | Ht 69.5 in | Wt 218.0 lb

## 2021-01-10 DIAGNOSIS — E8881 Metabolic syndrome: Secondary | ICD-10-CM

## 2021-01-10 DIAGNOSIS — E669 Obesity, unspecified: Secondary | ICD-10-CM

## 2021-01-10 DIAGNOSIS — K760 Fatty (change of) liver, not elsewhere classified: Secondary | ICD-10-CM

## 2021-01-10 DIAGNOSIS — R1011 Right upper quadrant pain: Secondary | ICD-10-CM

## 2021-01-10 NOTE — Progress Notes (Signed)
Larry Espinoza EVERY 25 y.o. [birth year] [id number] Referred by: Adrienne Mocha, PA  Assessment & Plan:   Encounter Diagnoses  Name Primary?  Marland Kitchen NAFLD (nonalcoholic fatty liver disease) Yes  . Abdominal obesity and metabolic syndrome likely   . RUQ pain - resolved     I think he had some sort of viral syndrome that came and went causing all the symptoms.  He does appear to have nonalcoholic fatty liver disease.  I have reviewed lower carbohydrate diet and restricted feeding/intermittent fasting for him.  I will review his lab work-up it sounds like it was all negative.  They had considered a HIDA scan and I do not think that is necessary.  We reviewed nonalcoholic fatty liver disease and how that is a precursor to prediabetes and diabetes and can spiral out of control leading to significant metabolic syndrome.  I have encouraged him to move to a lower carbohydrate eating pattern and time restricted eating/possible intermittent fasting.  Information and links to websites provided.  I appreciate the opportunity to care for this patient. CC: Adrienne Mocha, PA  Subjective:   Chief Complaint: Right upper quadrant pain fatty liver  HPI Larry Espinoza is a 25 year old white man who was in the emergency department on February 15 and 16 with right upper quadrant pain and nausea and vomiting.  He was sick for several days with this.  The pain was fairly intense.  He had a CT scan abdomen and pelvis without contrast that showed no findings related to his right upper quadrant/flank pain.  He ended up having an ultrasound of the right upper quadrant showing changes consistent with hepatic steatosis but no other focal lesions gallstones or biliary dilation.  He had an ALT of 71 a glucose of 105 but otherwise normal chemistries.  Lipase is normal. NL Hgb chronic microcytosis normal platelets  He feels completely well now and he reports that he had a thorough serologic evaluation at [facility] which is not  available to me at this time. No Known Allergies Current Meds  Medication Sig  . albuterol (PROVENTIL HFA;VENTOLIN HFA) 108 (90 BASE) MCG/ACT inhaler Inhale 1-2 puffs into the lungs every 6 (six) hours as needed for wheezing or shortness of breath.  . cetirizine (ZYRTEC) 10 MG tablet Take 1 tablet by mouth daily.  . fluticasone (FLONASE) 50 MCG/ACT nasal spray Place 1 spray into both nostrils daily.  . Fluticasone-Salmeterol (ADVAIR) 100-50 MCG/DOSE AEPB Inhale 1 puff into the lungs 2 (two) times daily.  Marland Kitchen loratadine (CLARITIN) 10 MG tablet Take 10 mg by mouth daily.  . montelukast (SINGULAIR) 10 MG tablet Take 10 mg by mouth at bedtime.  . ondansetron (ZOFRAN ODT) 4 MG disintegrating tablet Take 1 tablet (4 mg total) by mouth every 8 (eight) hours as needed for nausea or vomiting.  . pantoprazole (PROTONIX) 20 MG tablet Take 1 tablet (20 mg total) by mouth daily.   Past Medical History:  Diagnosis Date  . Asthma   . COVID-19 06/2020   Past Surgical History:  Procedure Laterality Date  . WISDOM TOOTH EXTRACTION     Social History   Social History Narrative   Single, no kids    employed as a Designer, television/film set at [employer]   About the start [employer] program at [employer]   Occasional alcohol 2 caffeinated beverages a day never smoker no drug use   family history includes Heart disease in his maternal grandfather, maternal grandmother, paternal grandfather, and  paternal grandmother; Hypertension in his father and another family member; Prostate cancer in his paternal grandfather.   Review of Systems As per HPI all other review of systems negative  Objective:   Physical Exam @BP  126/80 (BP Location: Left Arm, Patient Position: Sitting, Cuff Size: Normal)   Pulse 80   Ht 5' 9.5" (1.765 m)   Wt 218 lb (98.9 kg)   BMI 31.73 kg/m @  General:  Well-developed, well-nourished and in no acute distress mildly obese Eyes:  anicteric. Lungs: Clear to  auscultation bilaterally. Heart:  S1S2, no rubs, murmurs, gallops. Abdomen:  soft, non-tender, no hepatosplenomegaly, hernia, or mass and BS+.  There is mild to moderate obesity of the abdomen Extremities:   no edema, cyanosis or clubbing Skin   no rash. Neuro:  A&O x 3.  Psych:  appropriate mood and  Affect.   Data Reviewed: See HPI

## 2021-01-10 NOTE — Patient Instructions (Signed)
I think you probably had some sort of bad virus that made you sick in February.  I will get records of all of your labs just to go over them and recommend any additional testing though I think minimal may be repeat labs.  You do have evidence of fatty liver disease that needs attention to prevent further problems.  As I mentioned this is often the first sign of metabolic syndrome and is related to belly fat and if this pattern continues you can go on to diabetes and other health problems you do not want.  Changing how you eat is not easy but it is doable and I am providing the following pointers.  Look up the website med instead of meds which is about the Mediterranean diet.  I am giving you a Mediterranean diet sheet as well.  Avoiding processed foods is very important.  Read The Obesity Code by Dr. Wylene Simmer and implement  suggestions. Investigate and sign up for the www.dietdoctor.com website if desired and utilize those resources. Checkout Dr. Harley Hallmark on YouTube  I have provided handouts on insulin resistance, restricted feeding/intermittent fasting, and proper food choices to lower and eliminate insulin resistance and lose weight.  Lower carbohydrates is important and the Mediterranean diet is a tolerable eating pattern I think.  There are others which you can explore. Have a long-term approach to this and do not expect rapid results but have a 1 to 2-year timeframe to change her eating and to become fat adapted which means burning fat instead of over eating carbohydrates that leads to fat deposition.   I appreciate the opportunity to care for you.  Iva Boop, MD, [facility]

## 2021-01-10 NOTE — Progress Notes (Deleted)
   Larry Espinoza 25 y.o. January 05, 1996 [id number]  Assessment & Plan:      Subjective:   Chief Complaint:  HPI  No Known Allergies Current Meds  Medication Sig  . albuterol (PROVENTIL HFA;VENTOLIN HFA) 108 (90 BASE) MCG/ACT inhaler Inhale 1-2 puffs into the lungs every 6 (six) hours as needed for wheezing or shortness of breath.  . cetirizine (ZYRTEC) 10 MG tablet Take 1 tablet by mouth daily.  . fluticasone (FLONASE) 50 MCG/ACT nasal spray Place 1 spray into both nostrils daily.  . Fluticasone-Salmeterol (ADVAIR) 100-50 MCG/DOSE AEPB Inhale 1 puff into the lungs 2 (two) times daily.  Marland Kitchen loratadine (CLARITIN) 10 MG tablet Take 10 mg by mouth daily.  . montelukast (SINGULAIR) 10 MG tablet Take 10 mg by mouth at bedtime.  . ondansetron (ZOFRAN ODT) 4 MG disintegrating tablet Take 1 tablet (4 mg total) by mouth every 8 (eight) hours as needed for nausea or vomiting.  . pantoprazole (PROTONIX) 20 MG tablet Take 1 tablet (20 mg total) by mouth daily.   Past Medical History:  Diagnosis Date  . Asthma    Past Surgical History:  Procedure Laterality Date  . WISDOM TOOTH EXTRACTION     Social History   Social History Narrative  . Not on file   family history includes Heart disease in his maternal grandfather, maternal grandmother, paternal grandfather, and paternal grandmother; Hypertension in his father and another family member; Prostate cancer in his paternal grandfather.   Review of Systems   Objective:   Physical Exam

## 2021-01-19 DIAGNOSIS — B070 Plantar wart: Secondary | ICD-10-CM | POA: Diagnosis not present

## 2021-01-30 DIAGNOSIS — B070 Plantar wart: Secondary | ICD-10-CM | POA: Diagnosis not present

## 2021-01-31 ENCOUNTER — Encounter: Payer: Self-pay | Admitting: Internal Medicine

## 2021-01-31 ENCOUNTER — Other Ambulatory Visit: Payer: Self-pay | Admitting: Internal Medicine

## 2021-01-31 DIAGNOSIS — K760 Fatty (change of) liver, not elsewhere classified: Secondary | ICD-10-CM

## 2021-01-31 HISTORY — DX: Fatty (change of) liver, not elsewhere classified: K76.0

## 2021-02-13 DIAGNOSIS — B070 Plantar wart: Secondary | ICD-10-CM | POA: Diagnosis not present

## 2021-02-28 DIAGNOSIS — B070 Plantar wart: Secondary | ICD-10-CM | POA: Diagnosis not present

## 2021-03-14 DIAGNOSIS — B070 Plantar wart: Secondary | ICD-10-CM | POA: Diagnosis not present

## 2021-04-18 DIAGNOSIS — B070 Plantar wart: Secondary | ICD-10-CM | POA: Diagnosis not present

## 2021-05-30 ENCOUNTER — Other Ambulatory Visit (HOSPITAL_COMMUNITY): Payer: Self-pay

## 2021-05-30 DIAGNOSIS — F411 Generalized anxiety disorder: Secondary | ICD-10-CM | POA: Diagnosis not present

## 2021-05-30 DIAGNOSIS — K219 Gastro-esophageal reflux disease without esophagitis: Secondary | ICD-10-CM | POA: Diagnosis not present

## 2021-05-30 DIAGNOSIS — K760 Fatty (change of) liver, not elsewhere classified: Secondary | ICD-10-CM | POA: Diagnosis not present

## 2021-05-30 MED ORDER — PANTOPRAZOLE SODIUM 40 MG PO TBEC
DELAYED_RELEASE_TABLET | ORAL | 2 refills | Status: AC
  Filled 2021-05-30: qty 90, 90d supply, fill #0
  Filled 2022-01-26: qty 90, 90d supply, fill #1

## 2021-05-30 MED ORDER — BUPROPION HCL ER (XL) 150 MG PO TB24
ORAL_TABLET | ORAL | 0 refills | Status: DC
  Filled 2021-05-30: qty 30, 30d supply, fill #0

## 2021-06-08 ENCOUNTER — Other Ambulatory Visit (HOSPITAL_COMMUNITY): Payer: Self-pay

## 2021-06-08 MED ORDER — CARESTART COVID-19 HOME TEST VI KIT
PACK | 0 refills | Status: AC
  Filled 2021-06-08: qty 4, 4d supply, fill #0

## 2021-06-16 ENCOUNTER — Telehealth: Payer: 59 | Admitting: Physician Assistant

## 2021-06-16 ENCOUNTER — Encounter: Payer: Self-pay | Admitting: Physician Assistant

## 2021-06-16 DIAGNOSIS — J4521 Mild intermittent asthma with (acute) exacerbation: Secondary | ICD-10-CM | POA: Diagnosis not present

## 2021-06-16 DIAGNOSIS — Z20822 Contact with and (suspected) exposure to covid-19: Secondary | ICD-10-CM

## 2021-06-16 DIAGNOSIS — R0689 Other abnormalities of breathing: Secondary | ICD-10-CM

## 2021-06-16 DIAGNOSIS — R059 Cough, unspecified: Secondary | ICD-10-CM

## 2021-06-16 MED ORDER — PSEUDOEPH-BROMPHEN-DM 30-2-10 MG/5ML PO SYRP: 5.0000 mL | ORAL_SOLUTION | Freq: Three times a day (TID) | ORAL | 0 refills | Status: AC | PRN

## 2021-06-16 MED ORDER — PREDNISONE 20 MG PO TABS: 40.0000 mg | ORAL_TABLET | Freq: Every day | ORAL | 0 refills | Status: AC

## 2021-06-16 MED ORDER — PREDNISONE 20 MG PO TABS
20.0000 mg | ORAL_TABLET | Freq: Every day | ORAL | 0 refills | Status: DC
  Filled 2021-06-16: qty 10, 10d supply, fill #0

## 2021-06-16 MED ORDER — PSEUDOEPH-BROMPHEN-DM 30-2-10 MG/5ML PO SYRP: 5.0000 mL | ORAL_SOLUTION | Freq: Three times a day (TID) | ORAL | 0 refills | Status: DC | PRN

## 2021-06-16 NOTE — Progress Notes (Addendum)
Virtual Visit via Video Note  I connected with Larry Espinoza on 06/16/21 at  3:15 PM EDT by a video enabled telemedicine application and verified that I am speaking with the correct person using two identifiers.  Location: Patient: Patient's home Provider: Provider's office  Person participating in the virtual visit: Patient and provider    I discussed the limitations of evaluation and management by telemedicine and the availability of in person appointments. The patient expressed understanding and agreed to proceed.  I discussed the assessment and treatment plan with the patient. The patient was provided an opportunity to ask questions and all were answered. The patient agreed with the plan and demonstrated an understanding of the instructions.   The patient was advised to call back or seek an in-person evaluation if the symptoms worsen or if the condition fails to improve as anticipated.  I provided 15 minutes of non-face-to-face time during this encounter.   Waldon Merl, PA-C   Subjective:    Patient ID: Larry Espinoza, male    DOB: 12-30-1995, 25 y.o.   MRN: [id number]  No chief complaint on file.   25 yo  M in NAD with PMH of Asthma on Advair daily and albuterol inhaler as needed, connects via video , complains of cough, sinus pressure, post nasal drip, wheezing, dyspnea x  3 days. Cough is productive of yellow phelm. Has bees using Mucinex and using his albuterol rescue inhaler and has done a nebulizer tx with only temporary relief of his sxs. The cough is dry. States he did covid 19 testing, but that was before his onset of sxs- approx 5 days ago, which was negative. Denies any known covid 19 exposure or exposure to any other illness.   Other Associated symptoms include coughing. Pertinent negatives include no abdominal pain, chest pain, chills, congestion, diaphoresis, fatigue, fever, myalgias, nausea, rash, sore throat, vomiting or weakness.  Patient is in today for  cough, wheezing, difficulty breathing  Past Medical History:  Diagnosis Date   Asthma    COVID-19 06/2020   NAFLD (nonalcoholic fatty liver disease) 01/31/2021   Acute hepatitis panel ANA  Ferritin all ok    Past Surgical History:  Procedure Laterality Date   WISDOM TOOTH EXTRACTION      Family History  Problem Relation Age of Onset   Hypertension Other    Hypertension Father    Heart disease Maternal Grandmother    Heart disease Maternal Grandfather    Heart disease Paternal Grandmother    Prostate cancer Paternal Grandfather    Heart disease Paternal Grandfather     Social History   Socioeconomic History   Marital status: Single    Spouse name: Not on file   Number of children: 0   Years of education: Not on file   Highest education level: Not on file  Occupational History   Occupation: [employer]  Tobacco Use   Smoking status: Never   Smokeless tobacco: Never  Vaping Use   Vaping Use: Never used  Substance and Sexual Activity   Alcohol use: Yes    Comment: occasional   Drug use: No   Sexual activity: Not on file  Other Topics Concern   Not on file  Social History Narrative   Single, no kids    employed as a Engineer, site at [employer]   About the start [facility] program at [employer]   Occasional alcohol 2 caffeinated beverages a day never smoker no drug  use   Social Determinants of Radio broadcast assistant Strain: Not on file  Food Insecurity: Not on file  Transportation Needs: Not on file  Physical Activity: Not on file  Stress: Not on file  Social Connections: Not on file  Intimate Partner Violence: Not on file    Outpatient Medications Prior to Visit  Medication Sig Dispense Refill   albuterol (PROVENTIL HFA;VENTOLIN HFA) 108 (90 BASE) MCG/ACT inhaler Inhale 1-2 puffs into the lungs every 6 (six) hours as needed for wheezing or shortness of breath.     buPROPion (WELLBUTRIN XL) 150 MG 24 hr  tablet Take 1 tablet by mouth once daily in the morning 30 tablet 0   cetirizine (ZYRTEC) 10 MG tablet Take 1 tablet by mouth daily.     COVID-19 At Home Antigen Test [facility] COVID-19 HOME TEST) KIT Use as directed 4 each 0   fluticasone (FLONASE) 50 MCG/ACT nasal spray Place 1 spray into both nostrils daily.     Fluticasone-Salmeterol (ADVAIR) 100-50 MCG/DOSE AEPB Inhale 1 puff into the lungs 2 (two) times daily.     loratadine (CLARITIN) 10 MG tablet Take 10 mg by mouth daily.     montelukast (SINGULAIR) 10 MG tablet Take 10 mg by mouth at bedtime.     ondansetron (ZOFRAN ODT) 4 MG disintegrating tablet Take 1 tablet (4 mg total) by mouth every 8 (eight) hours as needed for nausea or vomiting. 20 tablet 0   ondansetron (ZOFRAN-ODT) 4 MG disintegrating tablet DISSOLVE 1 TABLET ON THE TONGUE AND ALLOW TO DISSOLVE DAILY AS NEEDED FOR NAUSEA 10 tablet 0   ondansetron (ZOFRAN-ODT) 4 MG disintegrating tablet DISSOLVE 1 TABLET UNDER THE TONGUE ONCE A DAY AS NEEDED FOR NAUSEA 10 tablet 0   pantoprazole (PROTONIX) 20 MG tablet TAKE 1 TABLET (20 MG TOTAL) BY MOUTH DAILY. 30 tablet 1   pantoprazole (PROTONIX) 40 MG tablet Take 1 tablet by mouth once a day 90 tablet 2   traMADol (ULTRAM) 50 MG tablet Take 50 mg by mouth daily as needed. (Patient not taking: Reported on 01/10/2021)     No facility-administered medications prior to visit.    No Known Allergies  Review of Systems  Constitutional:  Negative for activity change, appetite change, chills, diaphoresis, fatigue and fever.  HENT:  Negative for congestion, ear discharge, ear pain, postnasal drip, rhinorrhea, sinus pressure, sinus pain, sneezing, sore throat, tinnitus, trouble swallowing and voice change.   Respiratory:  Positive for cough, shortness of breath and wheezing. Negative for apnea, choking, chest tightness and stridor.   Cardiovascular:  Negative for chest pain, palpitations and leg swelling.  Gastrointestinal:  Negative for  abdominal pain, diarrhea, nausea and vomiting.  Musculoskeletal:  Negative for myalgias.  Skin:  Negative for rash.  Neurological:  Negative for dizziness, weakness and light-headedness.  Hematological:  Negative for adenopathy.  Psychiatric/Behavioral:  Negative for agitation, behavioral problems and confusion.       Objective:    Physical Exam Constitutional:      General: He is not in acute distress.    Appearance: Normal appearance. He is not ill-appearing, toxic-appearing or diaphoretic.     Comments: Coughs while on video . Breathing normal without any distress  Pulmonary:     Effort: No respiratory distress.  Neurological:     Mental Status: He is alert and oriented to person, place, and time.  Psychiatric:        Mood and Affect: Mood normal.  Behavior: Behavior normal.        Thought Content: Thought content normal.        Judgment: Judgment normal.    There were no vitals taken for this visit. Wt Readings from Last 3 Encounters:  01/10/21 218 lb (98.9 kg)  11/29/20 223 lb (101.2 kg)  08/20/18 201 lb 12.8 oz (91.5 kg)    Health Maintenance Due  Topic Date Due   COVID-19 Vaccine (1) Never done   Pneumococcal Vaccine 30-26 Years old (1 - PCV) Never done   HPV VACCINES (1 - Male 2-dose series) Never done   HIV Screening  Never done   Hepatitis C Screening  Never done   TETANUS/TDAP  Never done   INFLUENZA VACCINE  05/14/2021       Topic Date Due   HPV VACCINES (1 - Male 2-dose series) Never done     No results found for: TSH Lab Results  Component Value Date   WBC 9.2 11/29/2020   HGB 13.3 11/29/2020   HCT 42.0 11/29/2020   MCV 63.3 (L) 11/29/2020   PLT 278 11/29/2020   Lab Results  Component Value Date   NA 138 11/29/2020   K 3.5 11/29/2020   CO2 26 11/29/2020   GLUCOSE 105 (H) 11/29/2020   BUN 16 11/29/2020   CREATININE 0.98 11/29/2020   BILITOT 0.8 11/29/2020   ALKPHOS 77 11/29/2020   AST 31 11/29/2020   ALT 71 (H) 11/29/2020   PROT  7.7 11/29/2020   ALBUMIN 4.3 11/29/2020   CALCIUM 9.2 11/29/2020   ANIONGAP 8 11/29/2020   No results found for: CHOL No results found for: HDL No results found for: LDLCALC No results found for: TRIG No results found for: CHOLHDL No results found for: HGBA1C     Assessment & Plan:   Problem List Items Addressed This Visit   None Visit Diagnoses     Mild intermittent asthma with exacerbation    -  Primary   Relevant Medications   predniSONE (DELTASONE) 20 MG tablet   brompheniramine-pseudoephedrine-DM 30-2-10 MG/5ML syrup   Suspected COVID-19 virus infection            Meds ordered this encounter  Medications   predniSONE (DELTASONE) 20 MG tablet    Sig: Take 1 tablet (20 mg total) by mouth daily with breakfast.    Dispense:  10 tablet    Refill:  0    Order Specific Question:   Supervising Provider    Answer:   MILLER, BRIAN [[id number]]   brompheniramine-pseudoephedrine-DM 30-2-10 MG/5ML syrup    Sig: Take 5 mLs by mouth 3 (three) times daily as needed.    Dispense:  120 mL    Refill:  0    Order Specific Question:   Supervising Provider    Answer:   Sabra Heck, BRIAN [[id number]]   Advised any worsening, of if no improvement in sxs to have a face to face visit for further evaluation. If shortness of breath continues, please go to the ER  Advised to continue albuterol inhaler q 4 hours as needed and daily advair  Waldon Merl, PA-C

## 2021-06-16 NOTE — Addendum Note (Signed)
Addended by: Demetrio Lapping on: 06/16/2021 04:36 PM   Modules accepted: Orders

## 2021-06-16 NOTE — Progress Notes (Addendum)
Based on what you shared with me, I feel your condition warrants further evaluation and I recommend that you submit a video visit or be seen in a face to face visit.   NOTE: There will be NO CHARGE for this eVisit   If you are having a true medical emergency please call 911.      For an urgent face to face visit, [place] has six urgent care centers for your convenience:     [facility] at [facility] Directions [phone] [address] [place], [place] [address]    [facility] Health Urgent Care Center [facility]) Get Driving Directions [phone] [address] [place], [place] [address]  [facility] Health Urgent Care Center [facility]) Get Driving Directions [phone] [address] [place],  [place]  [address]  [facility] at [facility] Get Driving Directions [phone] [address] [place], [place] [address]   [facility] Health Urgent Care at [facility] Get Driving Directions  [phone] [address] [place], [place] [address]   [facility] Health Urgent Care at [facility] Directions [phone] [address] [place], [place] [address]  Your MyChart E-visit questionnaire answers were reviewed by a board certified advanced clinical practitioner to complete your personal care plan based on your specific symptoms.  Thank you for using e-Visits.   I spent 5-10 minutes on review and completion of this note- [facility]

## 2021-06-19 ENCOUNTER — Other Ambulatory Visit (HOSPITAL_COMMUNITY): Payer: Self-pay

## 2021-06-27 ENCOUNTER — Other Ambulatory Visit (HOSPITAL_COMMUNITY): Payer: Self-pay

## 2021-06-27 DIAGNOSIS — F411 Generalized anxiety disorder: Secondary | ICD-10-CM | POA: Diagnosis not present

## 2021-06-27 DIAGNOSIS — J309 Allergic rhinitis, unspecified: Secondary | ICD-10-CM | POA: Diagnosis not present

## 2021-06-27 MED ORDER — BUPROPION HCL ER (XL) 150 MG PO TB24
ORAL_TABLET | ORAL | 1 refills | Status: DC
  Filled 2021-06-27: qty 90, 90d supply, fill #0
  Filled 2021-10-11: qty 90, 90d supply, fill #1

## 2021-06-27 MED ORDER — MONTELUKAST SODIUM 10 MG PO TABS
ORAL_TABLET | ORAL | 3 refills | Status: AC
  Filled 2021-06-27: qty 90, 90d supply, fill #0

## 2021-07-30 ENCOUNTER — Other Ambulatory Visit (HOSPITAL_COMMUNITY): Payer: Self-pay

## 2021-07-30 DIAGNOSIS — J309 Allergic rhinitis, unspecified: Secondary | ICD-10-CM | POA: Diagnosis not present

## 2021-07-30 DIAGNOSIS — J019 Acute sinusitis, unspecified: Secondary | ICD-10-CM | POA: Diagnosis not present

## 2021-07-30 MED ORDER — LEVOFLOXACIN 500 MG PO TABS
ORAL_TABLET | ORAL | 0 refills | Status: AC
  Filled 2021-07-30: qty 10, 10d supply, fill #0

## 2021-08-02 ENCOUNTER — Other Ambulatory Visit (HOSPITAL_COMMUNITY): Payer: Self-pay

## 2021-08-02 DIAGNOSIS — J4521 Mild intermittent asthma with (acute) exacerbation: Secondary | ICD-10-CM | POA: Diagnosis not present

## 2021-08-02 MED ORDER — HYDROCOD POLST-CPM POLST ER 10-8 MG/5ML PO SUER
ORAL | 0 refills | Status: AC
Start: 2021-08-02 — End: ?
  Filled 2021-08-02: qty 70, 7d supply, fill #0

## 2021-08-02 MED ORDER — PREDNISONE 20 MG PO TABS
ORAL_TABLET | ORAL | 0 refills | Status: AC
  Filled 2021-08-02: qty 10, 5d supply, fill #0

## 2021-08-08 DIAGNOSIS — M25562 Pain in left knee: Secondary | ICD-10-CM | POA: Diagnosis not present

## 2021-08-08 DIAGNOSIS — M222X2 Patellofemoral disorders, left knee: Secondary | ICD-10-CM | POA: Diagnosis not present

## 2021-09-03 ENCOUNTER — Other Ambulatory Visit (HOSPITAL_COMMUNITY): Payer: Self-pay

## 2021-09-03 DIAGNOSIS — J3089 Other allergic rhinitis: Secondary | ICD-10-CM | POA: Diagnosis not present

## 2021-09-03 DIAGNOSIS — H1045 Other chronic allergic conjunctivitis: Secondary | ICD-10-CM | POA: Diagnosis not present

## 2021-09-03 DIAGNOSIS — J4530 Mild persistent asthma, uncomplicated: Secondary | ICD-10-CM | POA: Diagnosis not present

## 2021-09-03 DIAGNOSIS — J301 Allergic rhinitis due to pollen: Secondary | ICD-10-CM | POA: Diagnosis not present

## 2021-09-03 MED ORDER — AZELASTINE HCL 0.05 % OP SOLN
OPHTHALMIC | 3 refills | Status: AC
Start: 2021-09-03 — End: ?
  Filled 2021-09-03: qty 6, 24d supply, fill #0
  Filled 2022-07-20: qty 6, 24d supply, fill #1

## 2021-09-03 MED ORDER — LEVOCETIRIZINE DIHYDROCHLORIDE 5 MG PO TABS
5.0000 mg | ORAL_TABLET | Freq: Every evening | ORAL | 3 refills | Status: AC
  Filled 2021-09-03: qty 30, 30d supply, fill #0

## 2021-09-03 MED ORDER — AEROCHAMBER PLUS MISC
1 refills | Status: AC
  Filled 2021-09-03: qty 1, 30d supply, fill #0

## 2021-10-11 ENCOUNTER — Other Ambulatory Visit (HOSPITAL_COMMUNITY): Payer: Self-pay

## 2021-10-16 ENCOUNTER — Other Ambulatory Visit (HOSPITAL_COMMUNITY): Payer: Self-pay

## 2021-10-16 DIAGNOSIS — J0100 Acute maxillary sinusitis, unspecified: Secondary | ICD-10-CM | POA: Diagnosis not present

## 2021-10-16 MED ORDER — AMOXICILLIN-POT CLAVULANATE 875-125 MG PO TABS
ORAL_TABLET | ORAL | 0 refills | Status: AC
  Filled 2021-10-16: qty 20, 10d supply, fill #0

## 2021-10-16 MED ORDER — BENZONATATE 200 MG PO CAPS
ORAL_CAPSULE | ORAL | 0 refills | Status: AC
  Filled 2021-10-16: qty 30, 10d supply, fill #0

## 2021-10-30 ENCOUNTER — Other Ambulatory Visit (HOSPITAL_COMMUNITY): Payer: Self-pay

## 2021-10-30 MED ORDER — CARESTART COVID-19 HOME TEST VI KIT
PACK | 0 refills | Status: AC
  Filled 2021-10-30: qty 4, 4d supply, fill #0

## 2021-11-12 DIAGNOSIS — J029 Acute pharyngitis, unspecified: Secondary | ICD-10-CM | POA: Diagnosis not present

## 2021-11-23 ENCOUNTER — Other Ambulatory Visit (HOSPITAL_COMMUNITY): Payer: Self-pay

## 2021-12-19 ENCOUNTER — Other Ambulatory Visit (HOSPITAL_COMMUNITY): Payer: Self-pay

## 2021-12-19 DIAGNOSIS — J4530 Mild persistent asthma, uncomplicated: Secondary | ICD-10-CM | POA: Diagnosis not present

## 2021-12-19 DIAGNOSIS — Z9109 Other allergy status, other than to drugs and biological substances: Secondary | ICD-10-CM | POA: Diagnosis not present

## 2021-12-19 DIAGNOSIS — Z0000 Encounter for general adult medical examination without abnormal findings: Secondary | ICD-10-CM | POA: Diagnosis not present

## 2021-12-19 DIAGNOSIS — Z13220 Encounter for screening for lipoid disorders: Secondary | ICD-10-CM | POA: Diagnosis not present

## 2021-12-19 DIAGNOSIS — F411 Generalized anxiety disorder: Secondary | ICD-10-CM | POA: Diagnosis not present

## 2021-12-19 MED ORDER — BUPROPION HCL ER (XL) 150 MG PO TB24
ORAL_TABLET | ORAL | 1 refills | Status: AC
  Filled 2021-12-19: qty 90, 90d supply, fill #0

## 2021-12-22 ENCOUNTER — Other Ambulatory Visit (HOSPITAL_COMMUNITY): Payer: Self-pay

## 2022-01-26 ENCOUNTER — Other Ambulatory Visit (HOSPITAL_COMMUNITY): Payer: Self-pay

## 2022-03-05 ENCOUNTER — Other Ambulatory Visit (HOSPITAL_COMMUNITY): Payer: Self-pay

## 2022-03-05 DIAGNOSIS — J301 Allergic rhinitis due to pollen: Secondary | ICD-10-CM | POA: Diagnosis not present

## 2022-03-05 DIAGNOSIS — H1045 Other chronic allergic conjunctivitis: Secondary | ICD-10-CM | POA: Diagnosis not present

## 2022-03-05 DIAGNOSIS — J4530 Mild persistent asthma, uncomplicated: Secondary | ICD-10-CM | POA: Diagnosis not present

## 2022-03-05 DIAGNOSIS — J3089 Other allergic rhinitis: Secondary | ICD-10-CM | POA: Diagnosis not present

## 2022-03-05 MED ORDER — ALBUTEROL SULFATE HFA 108 (90 BASE) MCG/ACT IN AERS
INHALATION_SPRAY | RESPIRATORY_TRACT | 0 refills | Status: DC
  Filled 2022-03-05: qty 18, 17d supply, fill #0
  Filled 2022-03-25: qty 18, 16d supply, fill #0

## 2022-03-05 MED ORDER — FLUTICASONE-SALMETEROL 250-50 MCG/ACT IN AEPB
INHALATION_SPRAY | RESPIRATORY_TRACT | 3 refills | Status: AC
  Filled 2022-03-05 – 2022-03-25 (×2): qty 60, 30d supply, fill #0

## 2022-03-14 ENCOUNTER — Other Ambulatory Visit (HOSPITAL_COMMUNITY): Payer: Self-pay

## 2022-03-25 ENCOUNTER — Other Ambulatory Visit (HOSPITAL_COMMUNITY): Payer: Self-pay

## 2022-06-25 ENCOUNTER — Other Ambulatory Visit (HOSPITAL_COMMUNITY): Payer: Self-pay

## 2022-06-25 DIAGNOSIS — F411 Generalized anxiety disorder: Secondary | ICD-10-CM | POA: Diagnosis not present

## 2022-06-25 MED ORDER — BUPROPION HCL ER (XL) 150 MG PO TB24
150.0000 mg | ORAL_TABLET | Freq: Every morning | ORAL | 2 refills | Status: AC
  Filled 2022-06-25: qty 90, 90d supply, fill #0

## 2022-07-20 ENCOUNTER — Other Ambulatory Visit (HOSPITAL_COMMUNITY): Payer: Self-pay

## 2022-08-30 ENCOUNTER — Other Ambulatory Visit (HOSPITAL_COMMUNITY): Payer: Self-pay

## 2022-08-30 DIAGNOSIS — J019 Acute sinusitis, unspecified: Secondary | ICD-10-CM | POA: Diagnosis not present

## 2022-08-30 DIAGNOSIS — J4521 Mild intermittent asthma with (acute) exacerbation: Secondary | ICD-10-CM | POA: Diagnosis not present

## 2022-08-30 MED ORDER — BENZONATATE 200 MG PO CAPS
200.0000 mg | ORAL_CAPSULE | Freq: Three times a day (TID) | ORAL | 0 refills | Status: AC
  Filled 2022-08-30: qty 30, 10d supply, fill #0

## 2022-08-30 MED ORDER — PREDNISONE 10 MG PO TABS
ORAL_TABLET | ORAL | 0 refills | Status: AC
  Filled 2022-08-30: qty 21, 6d supply, fill #0

## 2022-08-30 MED ORDER — AMOXICILLIN-POT CLAVULANATE 875-125 MG PO TABS
1.0000 | ORAL_TABLET | Freq: Two times a day (BID) | ORAL | 0 refills | Status: AC
  Filled 2022-08-30: qty 20, 10d supply, fill #0

## 2022-09-03 ENCOUNTER — Other Ambulatory Visit (HOSPITAL_COMMUNITY): Payer: Self-pay

## 2022-09-03 DIAGNOSIS — J301 Allergic rhinitis due to pollen: Secondary | ICD-10-CM | POA: Diagnosis not present

## 2022-09-03 DIAGNOSIS — J3089 Other allergic rhinitis: Secondary | ICD-10-CM | POA: Diagnosis not present

## 2022-09-03 DIAGNOSIS — H1045 Other chronic allergic conjunctivitis: Secondary | ICD-10-CM | POA: Diagnosis not present

## 2022-09-03 DIAGNOSIS — J4530 Mild persistent asthma, uncomplicated: Secondary | ICD-10-CM | POA: Diagnosis not present

## 2022-09-03 MED ORDER — ALBUTEROL SULFATE (2.5 MG/3ML) 0.083% IN NEBU
INHALATION_SOLUTION | RESPIRATORY_TRACT | 0 refills | Status: AC
  Filled 2022-09-03: qty 90, 5d supply, fill #0

## 2022-09-03 MED ORDER — FLUTICASONE-SALMETEROL 250-50 MCG/ACT IN AEPB
INHALATION_SPRAY | RESPIRATORY_TRACT | 3 refills | Status: AC
  Filled 2022-09-03: qty 60, 30d supply, fill #0
  Filled 2023-08-27: qty 60, 30d supply, fill #1

## 2022-09-03 MED ORDER — AZELASTINE HCL 0.05 % OP SOLN
OPHTHALMIC | 3 refills | Status: AC
  Filled 2022-09-03: qty 6, 30d supply, fill #0
  Filled 2023-08-28: qty 6, 30d supply, fill #1

## 2022-10-22 ENCOUNTER — Other Ambulatory Visit (HOSPITAL_COMMUNITY): Payer: Self-pay

## 2022-10-22 DIAGNOSIS — H109 Unspecified conjunctivitis: Secondary | ICD-10-CM | POA: Diagnosis not present

## 2022-10-22 MED ORDER — POLYMYXIN B-TRIMETHOPRIM 10000-0.1 UNIT/ML-% OP SOLN
OPHTHALMIC | 0 refills | Status: AC
  Filled 2022-10-22: qty 10, 17d supply, fill #0

## 2022-12-15 IMAGING — CT CT ABD-PELV W/O CM
1 of 2 series · 14 of 32 positions shown, 19 images · non-contrast
Comparison: None.

CLINICAL DATA: Flank pain.

EXAM:
CT ABDOMEN AND PELVIS WITHOUT CONTRAST
TECHNIQUE: Multidetector CT imaging of the abdomen and pelvis was performed
following the standard protocol without IV contrast.

[Series 2: renal standard/full · axial · 0.85mm/px · z∈[-489,-49]mm · 14 of 98 slices shown, 19 images]
[im 5/98  soft-tissue]
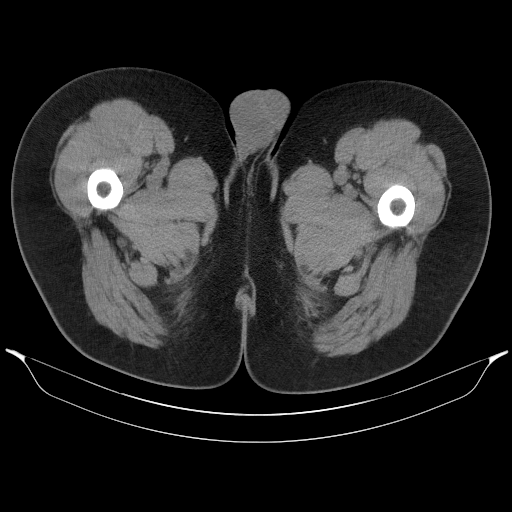
[im 5/98  bone]
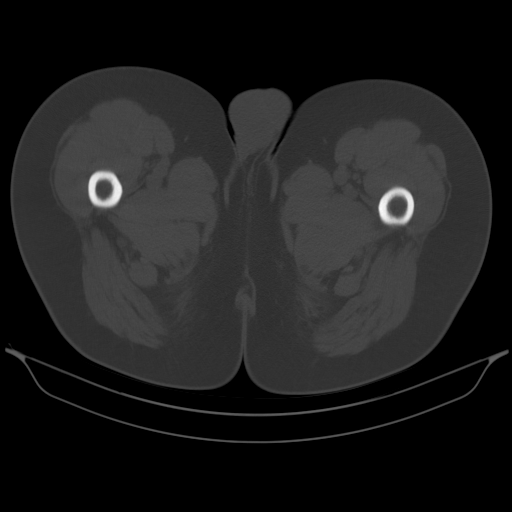
[im 15/98  soft-tissue]
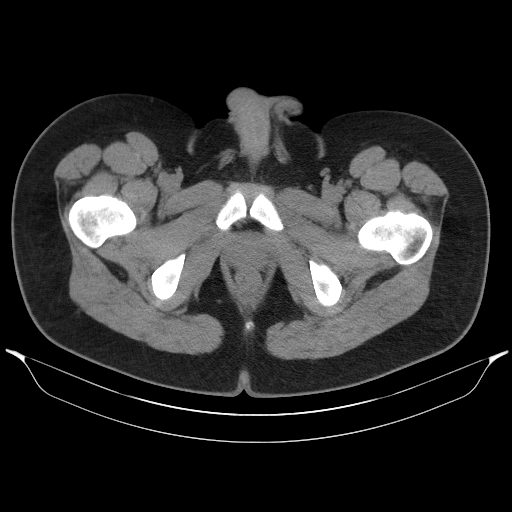
[im 20/98  soft-tissue]
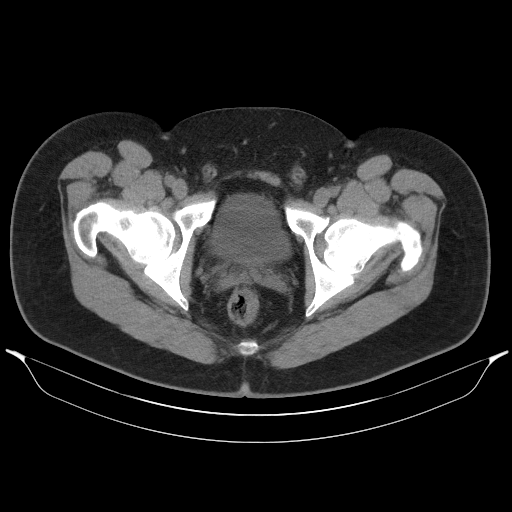
[im 30/98  soft-tissue]
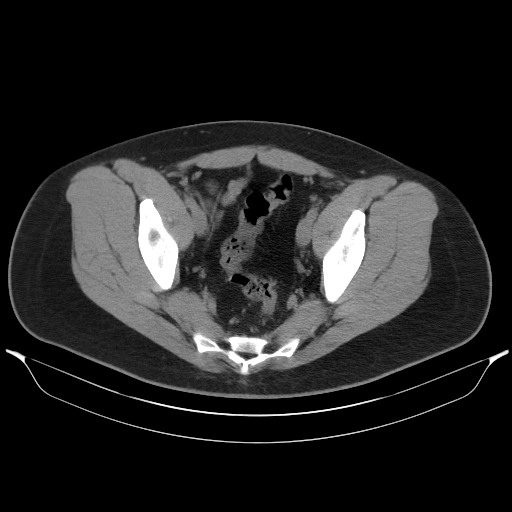
[im 34/98  soft-tissue]
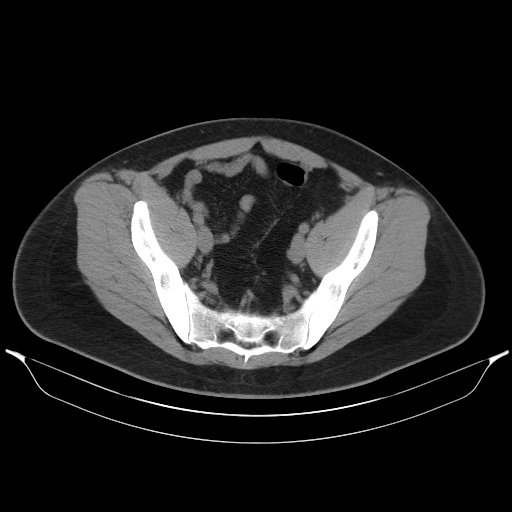
[im 44/98  soft-tissue]
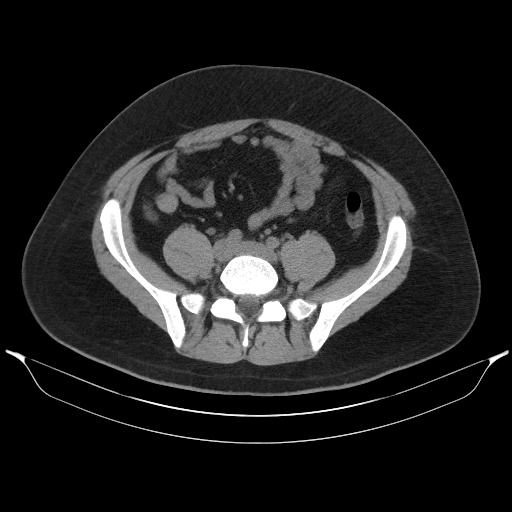
[im 49/98  soft-tissue]
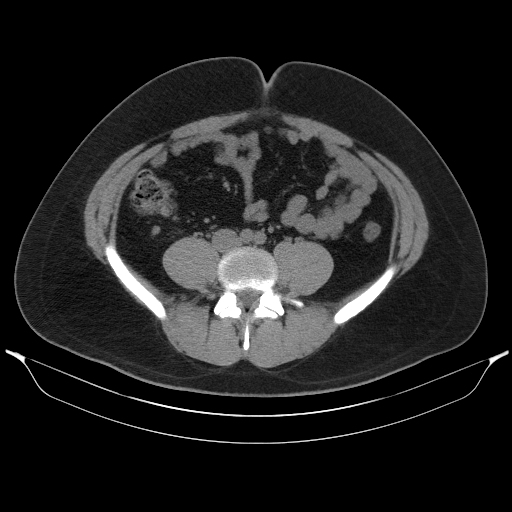
[im 54/98  soft-tissue]
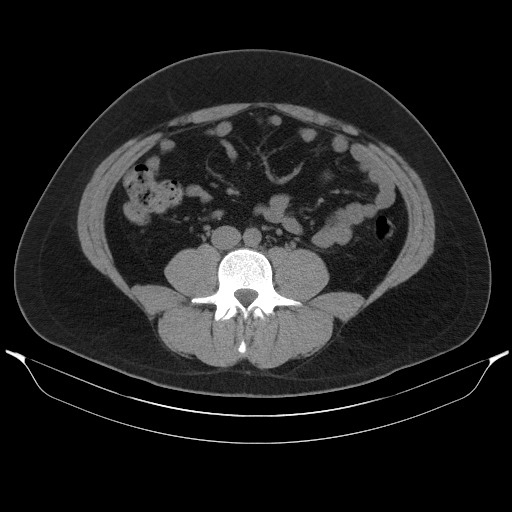
[im 64/98  soft-tissue]
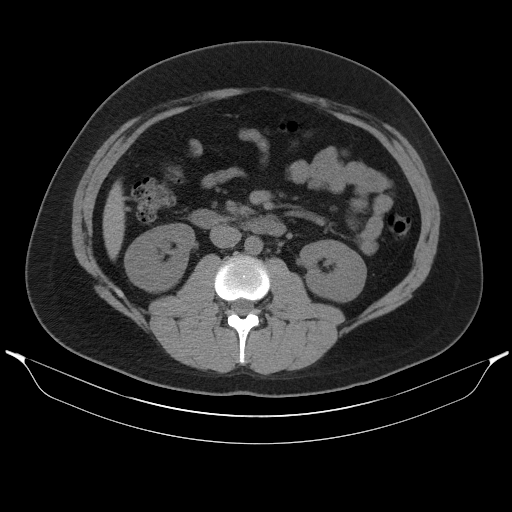
[im 64/98  bone]
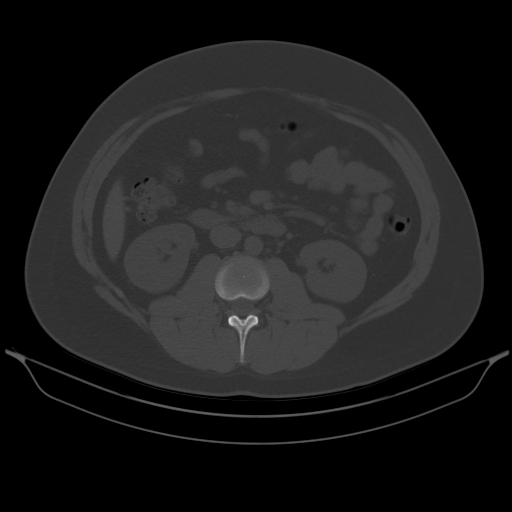
[im 68/98  soft-tissue]
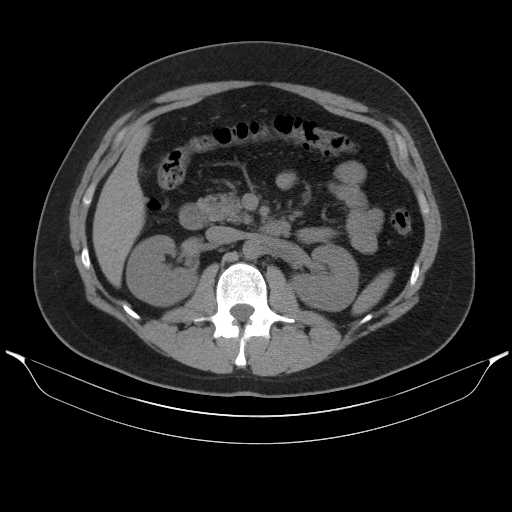
[im 78/98  soft-tissue]
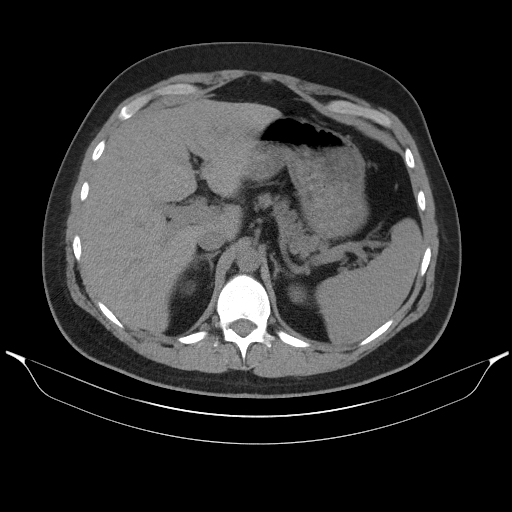
[im 78/98  lung]
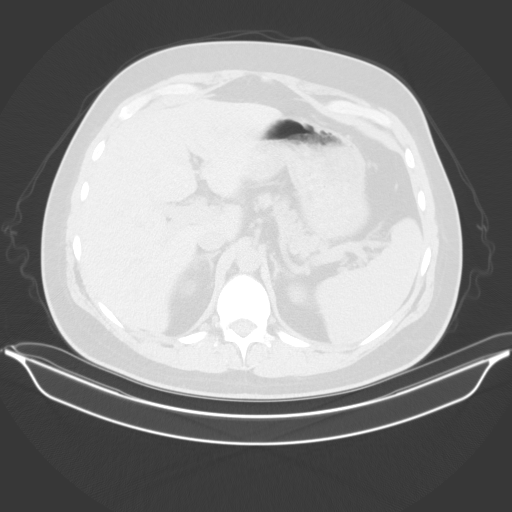
[im 83/98  soft-tissue]
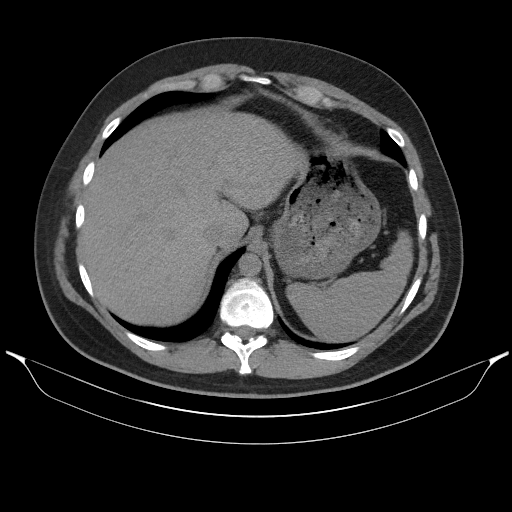
[im 83/98  lung]
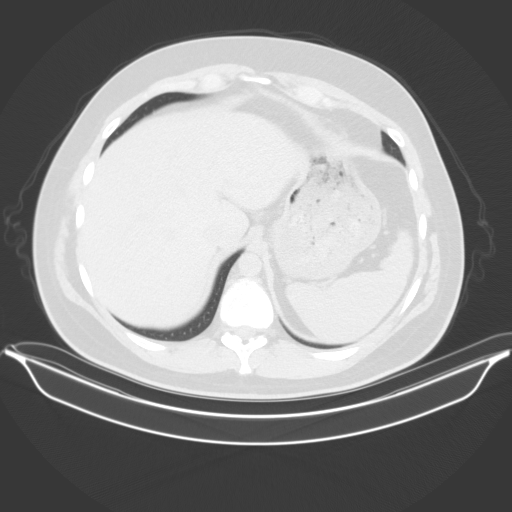
[im 88/98  lung]
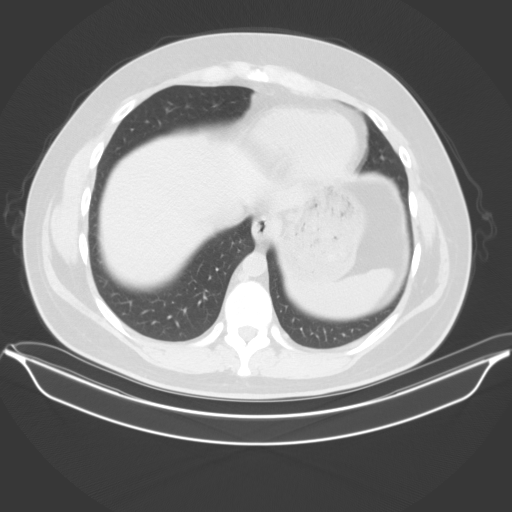
[im 93/98  soft-tissue]
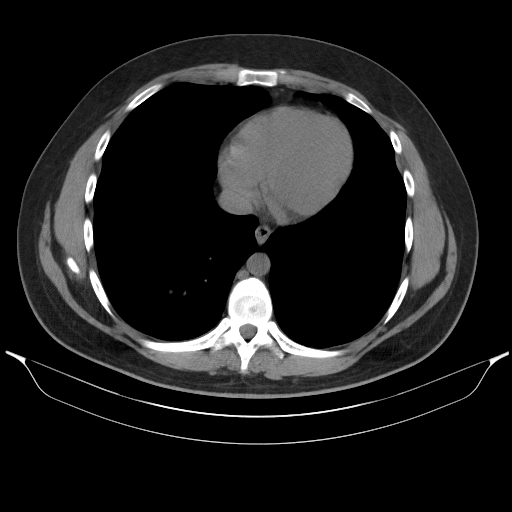
[im 93/98  lung]
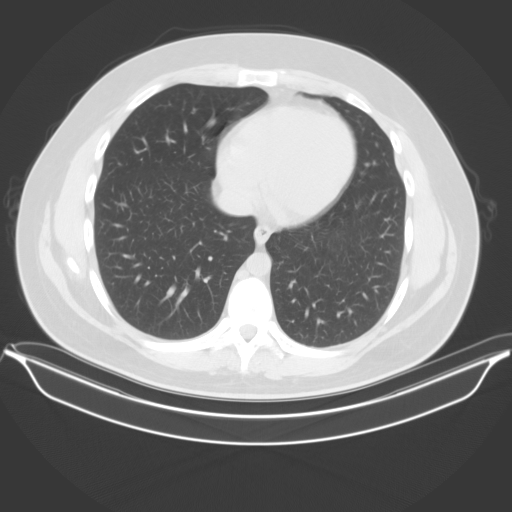

[14 of 32 positions shown; findings below may reference images not displayed]

FINDINGS: Lower chest: Unremarkable.

Hepatobiliary: No focal abnormality in the liver on this study
without intravenous contrast. Gallbladder nondistended. No
intrahepatic or extrahepatic biliary dilation.

Pancreas: No focal mass lesion. No dilatation of the main duct. No
intraparenchymal cyst. No peripancreatic edema.

Spleen: No splenomegaly. No focal mass lesion.

Adrenals/Urinary Tract: No adrenal nodule or mass. Unremarkable
noncontrast appearance of the kidneys. No renal or ureteral stones.
No secondary changes in either kidney or ureter no bladder stones.

Stomach/Bowel: Stomach is unremarkable. No gastric wall thickening.
No evidence of outlet obstruction. Duodenum is normally positioned
as is the ligament of Treitz. No small bowel wall thickening. No
small bowel dilatation. The terminal ileum is normal. The appendix
is normal. No gross colonic mass. No colonic wall thickening.

Vascular/Lymphatic: No abdominal aortic aneurysm. There is no
gastrohepatic or hepatoduodenal ligament lymphadenopathy. No
retroperitoneal or mesenteric lymphadenopathy. No pelvic sidewall
lymphadenopathy.

Reproductive: The prostate gland and seminal vesicles are
unremarkable.

Other: No intraperitoneal free fluid.

Musculoskeletal: No worrisome lytic or sclerotic osseous
abnormality.
IMPRESSION: No acute findings in the abdomen or pelvis. Specifically, no
findings to explain the patient's history of flank pain. No evidence
of urinary stone disease. No secondary changes in either kidney or
ureter.

## 2022-12-16 IMAGING — US US ABDOMEN LIMITED RUQ/ASCITES
1 series · 14 of 25 positions shown · non-contrast
Comparison: CT abdomen and pelvis November 28, 2020

CLINICAL DATA: Upper abdominal pain with nausea and vomiting

EXAM:
ULTRASOUND ABDOMEN LIMITED RIGHT UPPER QUADRANT

[Series 1: us abdomen limited ruq/ascites · 14 of 34 slices shown]
[im 1/34]
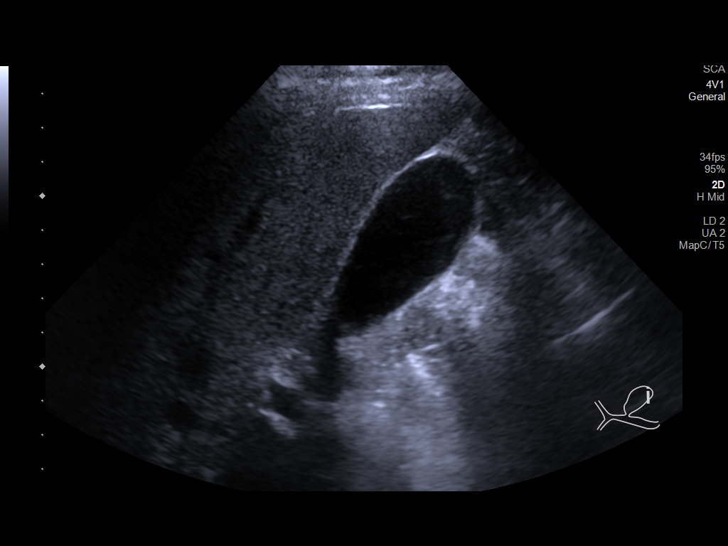
[im 3/34]
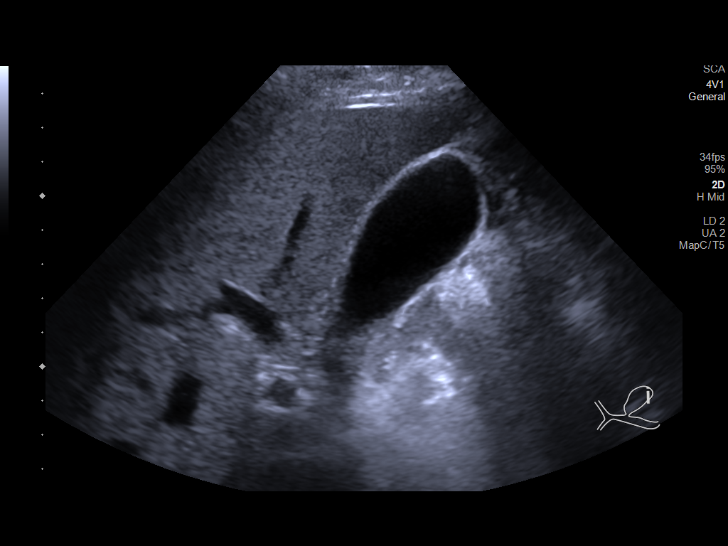
[im 6/34]
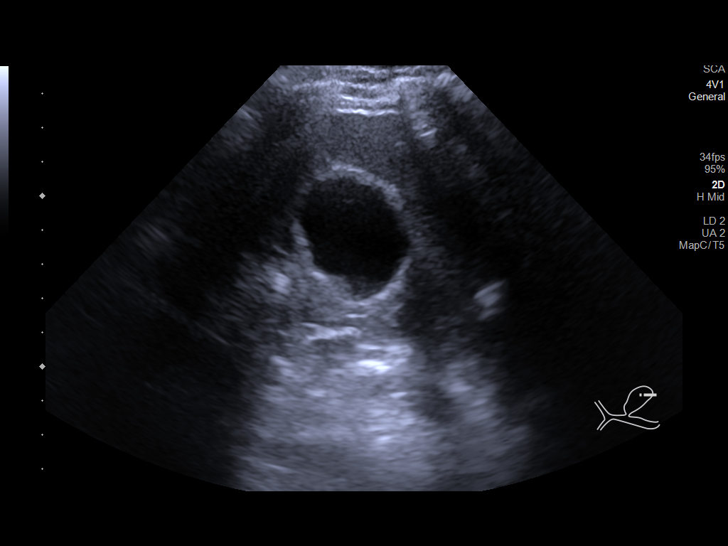
[im 9/34]
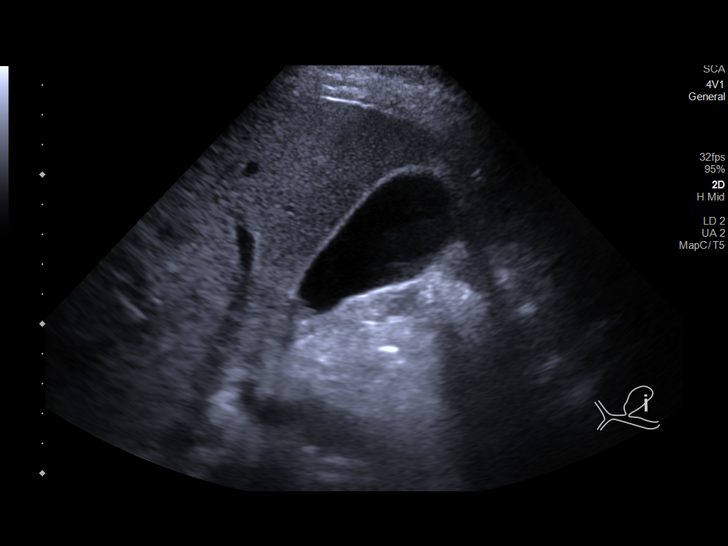
[im 12/34]
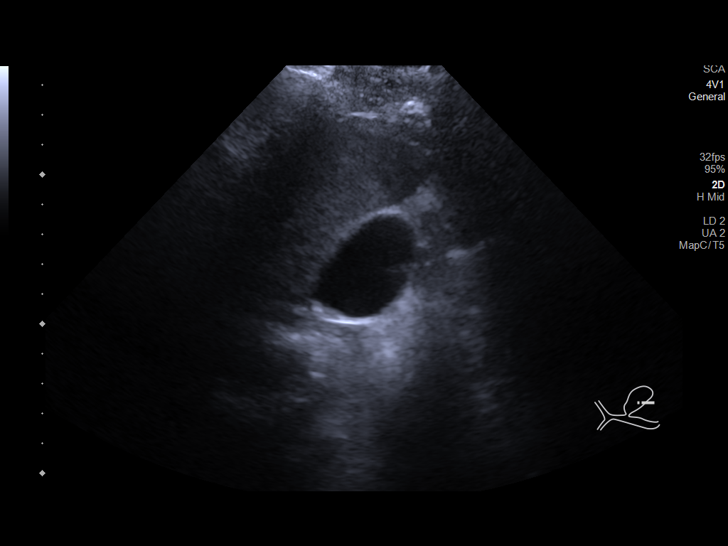
[im 13/34]
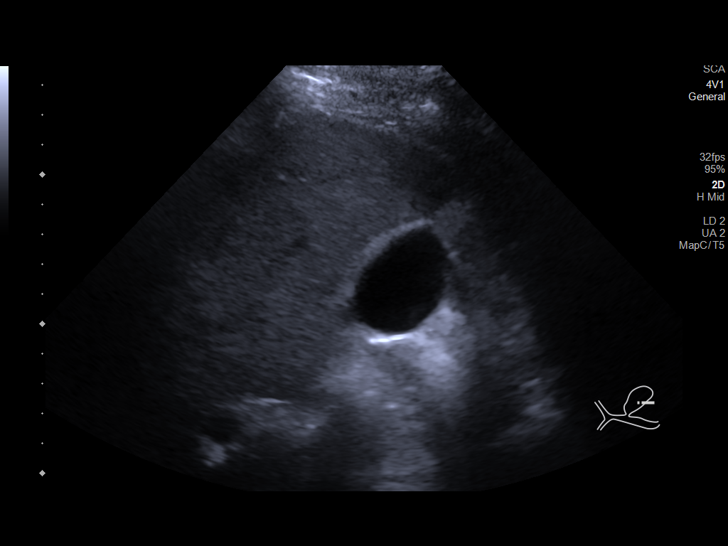
[im 16/34]
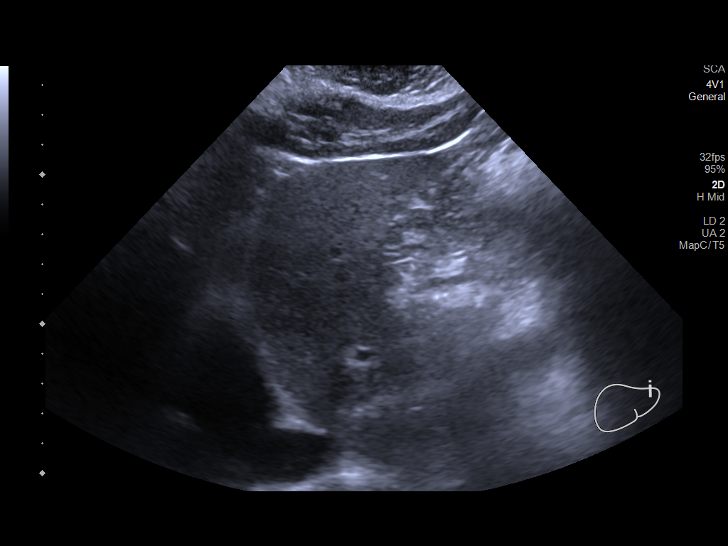
[im 18/34]
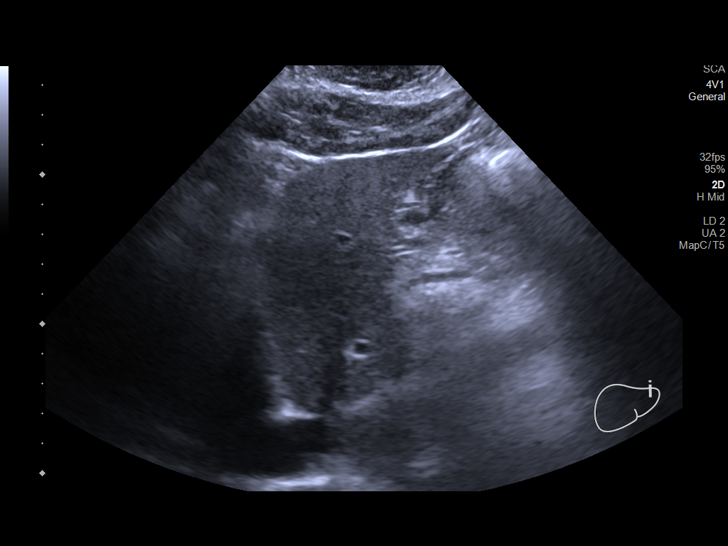
[im 21/34]
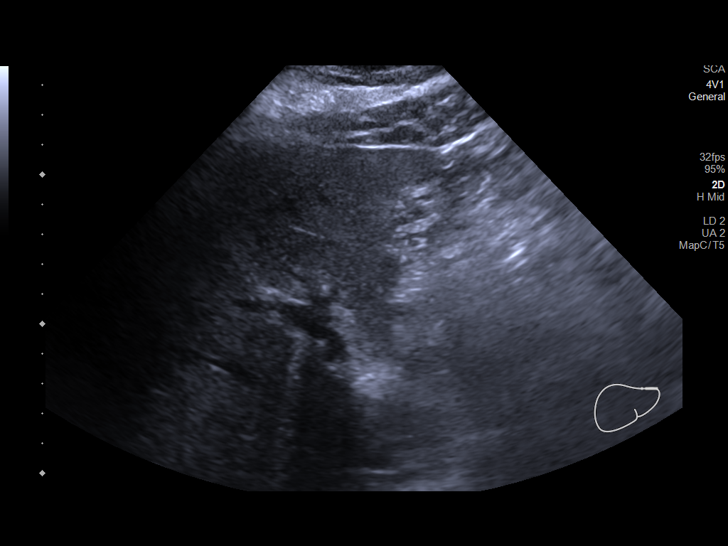
[im 23/34]
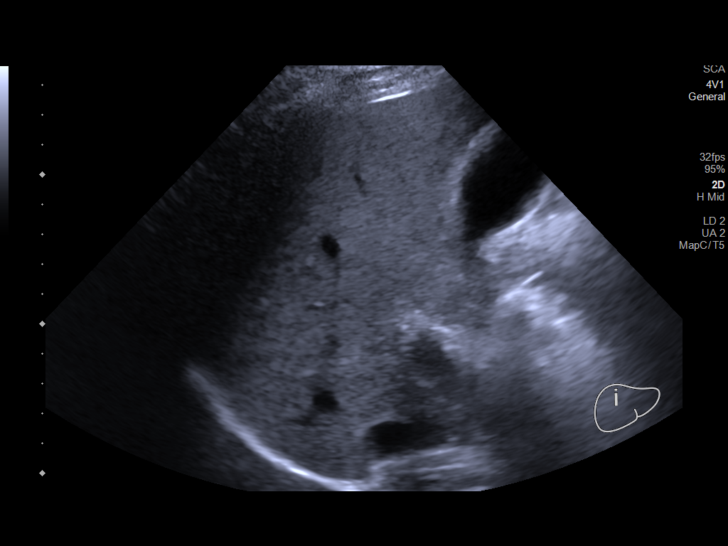
[im 25/34]
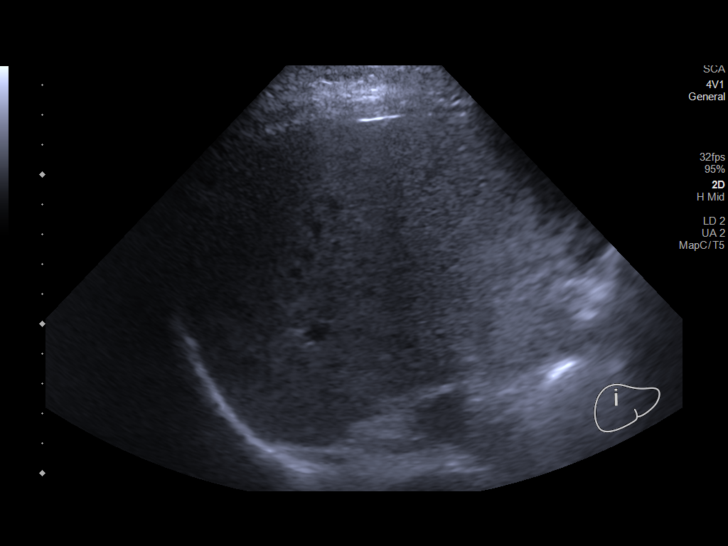
[im 28/34]
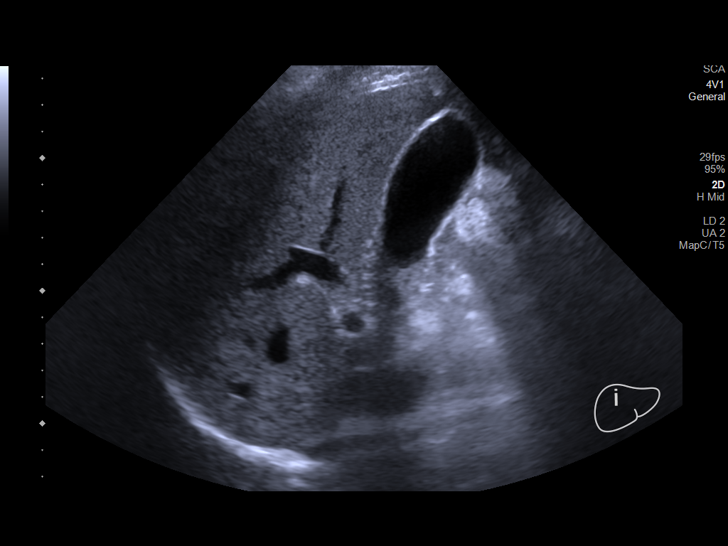
[im 31/34]
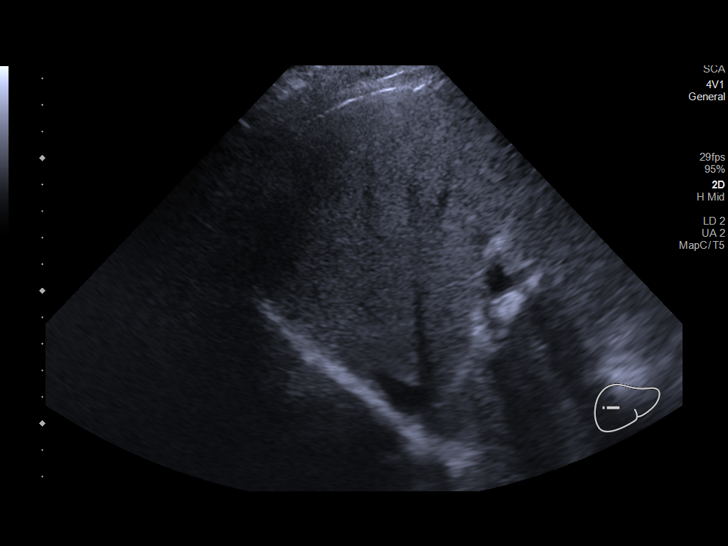
[im 34/34]
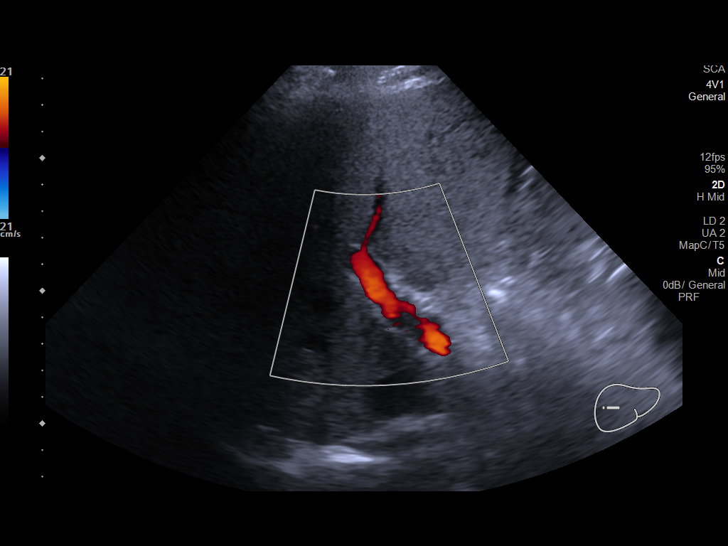

[14 of 25 positions shown; findings below may reference images not displayed]

FINDINGS: Gallbladder:

No gallstones or wall thickening visualized. There is no
pericholecystic fluid. No sonographic Murphy sign noted by
sonographer.

Common bile duct:

Diameter: 3 mm. No intrahepatic or extrahepatic biliary duct
dilatation.

Liver:

No focal lesion identified. Liver echogenicity is increased
diffusely. Portal vein is patent on color Doppler imaging with
normal direction of blood flow towards the liver.

Other: None.
IMPRESSION: Diffuse increase in liver echogenicity, an appearance indicative of
a degree of hepatic steatosis. No focal liver lesions are
appreciable. Study otherwise unremarkable.

## 2023-01-22 ENCOUNTER — Other Ambulatory Visit (HOSPITAL_COMMUNITY): Payer: Self-pay

## 2023-01-22 DIAGNOSIS — J4530 Mild persistent asthma, uncomplicated: Secondary | ICD-10-CM | POA: Diagnosis not present

## 2023-01-22 DIAGNOSIS — Z0000 Encounter for general adult medical examination without abnormal findings: Secondary | ICD-10-CM | POA: Diagnosis not present

## 2023-01-22 DIAGNOSIS — E6609 Other obesity due to excess calories: Secondary | ICD-10-CM | POA: Diagnosis not present

## 2023-01-22 DIAGNOSIS — Z13220 Encounter for screening for lipoid disorders: Secondary | ICD-10-CM | POA: Diagnosis not present

## 2023-01-22 DIAGNOSIS — F411 Generalized anxiety disorder: Secondary | ICD-10-CM | POA: Diagnosis not present

## 2023-01-22 DIAGNOSIS — Z9109 Other allergy status, other than to drugs and biological substances: Secondary | ICD-10-CM | POA: Diagnosis not present

## 2023-01-22 DIAGNOSIS — K219 Gastro-esophageal reflux disease without esophagitis: Secondary | ICD-10-CM | POA: Diagnosis not present

## 2023-01-22 MED ORDER — PANTOPRAZOLE SODIUM 40 MG PO TBEC
40.0000 mg | DELAYED_RELEASE_TABLET | Freq: Every day | ORAL | 3 refills | Status: AC
  Filled 2023-01-27: qty 90, 90d supply, fill #0
  Filled 2023-08-27: qty 30, 30d supply, fill #1
  Filled 2024-01-20: qty 30, 30d supply, fill #2

## 2023-01-22 MED ORDER — BUPROPION HCL ER (XL) 150 MG PO TB24
150.0000 mg | ORAL_TABLET | Freq: Every morning | ORAL | 1 refills | Status: DC
  Filled 2023-01-22: qty 90, 90d supply, fill #0
  Filled 2023-05-23: qty 90, 90d supply, fill #1

## 2023-01-22 MED ORDER — FLUTICASONE PROPIONATE 50 MCG/ACT NA SUSP
1.0000 | Freq: Every day | NASAL | 0 refills | Status: DC
  Filled 2023-01-22: qty 48, 90d supply, fill #0

## 2023-01-27 ENCOUNTER — Other Ambulatory Visit (HOSPITAL_COMMUNITY): Payer: Self-pay

## 2023-01-27 DIAGNOSIS — J301 Allergic rhinitis due to pollen: Secondary | ICD-10-CM | POA: Diagnosis not present

## 2023-01-27 DIAGNOSIS — J4530 Mild persistent asthma, uncomplicated: Secondary | ICD-10-CM | POA: Diagnosis not present

## 2023-01-27 DIAGNOSIS — J3089 Other allergic rhinitis: Secondary | ICD-10-CM | POA: Diagnosis not present

## 2023-01-27 DIAGNOSIS — H1045 Other chronic allergic conjunctivitis: Secondary | ICD-10-CM | POA: Diagnosis not present

## 2023-01-27 MED ORDER — ALBUTEROL SULFATE HFA 108 (90 BASE) MCG/ACT IN AERS
1.0000 | INHALATION_SPRAY | RESPIRATORY_TRACT | 0 refills | Status: AC | PRN
  Filled 2023-01-27: qty 6.7, 16d supply, fill #0

## 2023-01-27 MED ORDER — FLUTICASONE-SALMETEROL 250-50 MCG/ACT IN AEPB
1.0000 | INHALATION_SPRAY | Freq: Two times a day (BID) | RESPIRATORY_TRACT | 6 refills | Status: AC
  Filled 2023-01-27: qty 60, 30d supply, fill #0

## 2023-01-28 ENCOUNTER — Other Ambulatory Visit (HOSPITAL_COMMUNITY): Payer: Self-pay

## 2023-01-28 ENCOUNTER — Other Ambulatory Visit: Payer: Self-pay

## 2023-04-23 ENCOUNTER — Other Ambulatory Visit: Payer: Self-pay | Admitting: Oncology

## 2023-04-23 DIAGNOSIS — Z006 Encounter for examination for normal comparison and control in clinical research program: Secondary | ICD-10-CM

## 2023-07-23 ENCOUNTER — Other Ambulatory Visit (HOSPITAL_COMMUNITY): Payer: Self-pay

## 2023-07-23 MED ORDER — BUPROPION HCL ER (XL) 150 MG PO TB24
150.0000 mg | ORAL_TABLET | Freq: Every morning | ORAL | 3 refills | Status: AC
  Filled 2023-07-23: qty 90, 90d supply, fill #0
  Filled 2023-08-27: qty 30, 30d supply, fill #0
  Filled 2023-10-14: qty 30, 30d supply, fill #1
  Filled 2023-11-24: qty 30, 30d supply, fill #2
  Filled 2023-12-24: qty 30, 30d supply, fill #3
  Filled 2024-01-20: qty 30, 30d supply, fill #4

## 2023-08-01 ENCOUNTER — Other Ambulatory Visit (HOSPITAL_COMMUNITY): Payer: Self-pay

## 2023-08-27 ENCOUNTER — Other Ambulatory Visit (HOSPITAL_COMMUNITY): Payer: Self-pay

## 2023-08-28 ENCOUNTER — Other Ambulatory Visit (HOSPITAL_COMMUNITY): Payer: Self-pay

## 2023-08-28 MED ORDER — FLUTICASONE PROPIONATE 50 MCG/ACT NA SUSP
1.0000 | Freq: Every day | NASAL | 0 refills | Status: AC
  Filled 2023-08-28: qty 16, 30d supply, fill #0

## 2023-08-29 ENCOUNTER — Other Ambulatory Visit (HOSPITAL_COMMUNITY): Payer: Self-pay

## 2023-08-29 ENCOUNTER — Other Ambulatory Visit: Payer: Self-pay

## 2023-09-02 ENCOUNTER — Other Ambulatory Visit (HOSPITAL_BASED_OUTPATIENT_CLINIC_OR_DEPARTMENT_OTHER): Payer: Self-pay

## 2023-09-08 ENCOUNTER — Other Ambulatory Visit (HOSPITAL_COMMUNITY): Payer: Self-pay

## 2023-09-08 MED ORDER — AMOXICILLIN-POT CLAVULANATE 875-125 MG PO TABS
1.0000 | ORAL_TABLET | Freq: Two times a day (BID) | ORAL | 0 refills | Status: AC
  Filled 2023-09-08: qty 20, 10d supply, fill #0

## 2023-09-08 MED ORDER — PREDNISONE 10 MG (21) PO TBPK
ORAL_TABLET | ORAL | 0 refills | Status: AC
  Filled 2023-09-08: qty 21, 6d supply, fill #0

## 2023-10-14 ENCOUNTER — Other Ambulatory Visit: Payer: Self-pay

## 2023-10-14 ENCOUNTER — Other Ambulatory Visit (HOSPITAL_COMMUNITY): Payer: Self-pay

## 2023-10-16 ENCOUNTER — Other Ambulatory Visit (HOSPITAL_COMMUNITY): Payer: Self-pay

## 2023-10-16 MED ORDER — ALBUTEROL SULFATE HFA 108 (90 BASE) MCG/ACT IN AERS
INHALATION_SPRAY | RESPIRATORY_TRACT | 0 refills | Status: AC
  Filled 2023-10-16: qty 6.7, 20d supply, fill #0

## 2023-10-18 ENCOUNTER — Other Ambulatory Visit (HOSPITAL_COMMUNITY): Payer: Self-pay

## 2023-11-24 ENCOUNTER — Other Ambulatory Visit (HOSPITAL_COMMUNITY): Payer: Self-pay

## 2023-12-25 ENCOUNTER — Other Ambulatory Visit (HOSPITAL_COMMUNITY): Payer: Self-pay

## 2024-01-09 ENCOUNTER — Other Ambulatory Visit (HOSPITAL_COMMUNITY): Payer: Self-pay

## 2024-01-09 MED ORDER — PREDNISONE 10 MG PO TABS
ORAL_TABLET | ORAL | 0 refills | Status: AC
  Filled 2024-01-09: qty 30, 30d supply, fill #0

## 2024-01-09 MED ORDER — AMOXICILLIN-POT CLAVULANATE 875-125 MG PO TABS
ORAL_TABLET | ORAL | 0 refills | Status: AC
  Filled 2024-01-09: qty 20, 10d supply, fill #0

## 2024-01-20 ENCOUNTER — Other Ambulatory Visit (HOSPITAL_COMMUNITY): Payer: Self-pay

## 2024-01-28 ENCOUNTER — Other Ambulatory Visit (HOSPITAL_COMMUNITY): Payer: Self-pay

## 2024-01-28 ENCOUNTER — Encounter (HOSPITAL_COMMUNITY): Payer: Self-pay | Admitting: Pharmacist

## 2024-01-28 ENCOUNTER — Other Ambulatory Visit: Payer: Self-pay

## 2024-01-28 MED ORDER — ALBUTEROL SULFATE HFA 108 (90 BASE) MCG/ACT IN AERS
1.0000 | INHALATION_SPRAY | RESPIRATORY_TRACT | 0 refills | Status: AC
  Filled 2024-01-28: qty 6.7, 30d supply, fill #0

## 2024-01-28 MED ORDER — AZELASTINE HCL 0.05 % OP SOLN
1.0000 [drp] | Freq: Two times a day (BID) | OPHTHALMIC | 3 refills | Status: AC
  Filled 2024-01-28: qty 6, 30d supply, fill #0

## 2024-01-28 MED ORDER — LEVOCETIRIZINE DIHYDROCHLORIDE 5 MG PO TABS
5.0000 mg | ORAL_TABLET | Freq: Every morning | ORAL | 5 refills | Status: AC
  Filled 2024-01-28: qty 30, 30d supply, fill #0

## 2024-01-28 MED ORDER — FLUTICASONE-SALMETEROL 250-50 MCG/ACT IN AEPB
INHALATION_SPRAY | RESPIRATORY_TRACT | 6 refills | Status: AC
  Filled 2024-01-28: qty 60, 30d supply, fill #0

## 2024-01-28 MED ORDER — MONTELUKAST SODIUM 10 MG PO TABS
10.0000 mg | ORAL_TABLET | Freq: Every day | ORAL | 5 refills | Status: AC
  Filled 2024-01-28: qty 30, 30d supply, fill #0

## 2024-01-28 MED ORDER — FLUTICASONE PROPIONATE 50 MCG/ACT NA SUSP
1.0000 | Freq: Every day | NASAL | 5 refills | Status: AC
Start: 2024-01-28 — End: ?
  Filled 2024-01-28: qty 16, 30d supply, fill #0

## 2024-03-01 ENCOUNTER — Other Ambulatory Visit (HOSPITAL_COMMUNITY): Payer: Self-pay

## 2024-07-23 ENCOUNTER — Other Ambulatory Visit: Payer: Self-pay | Admitting: Medical Genetics

## 2024-07-23 DIAGNOSIS — Z006 Encounter for examination for normal comparison and control in clinical research program: Secondary | ICD-10-CM

## 2024-07-23 NOTE — Progress Notes (Signed)
ge
